# Patient Record
Sex: Male | Born: 1995 | Race: Black or African American | Hispanic: No | Marital: Single | Smoking: Former smoker
Health system: Southern US, Community
[De-identification: ages and names within clinical notes are randomized; demographics above are authoritative.]

## PROBLEM LIST (undated history)

## (undated) DIAGNOSIS — I428 Other cardiomyopathies: Secondary | ICD-10-CM

## (undated) DIAGNOSIS — K429 Umbilical hernia without obstruction or gangrene: Secondary | ICD-10-CM

## (undated) DIAGNOSIS — R0600 Dyspnea, unspecified: Secondary | ICD-10-CM

## (undated) HISTORY — PX: UMBILICAL HERNIA REPAIR: SHX196

---
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
## 2014-03-12 ENCOUNTER — Ambulatory Visit (INDEPENDENT_AMBULATORY_CARE_PROVIDER_SITE_OTHER): Payer: 59 | Admitting: Family Medicine

## 2014-03-12 VITALS — BP 118/70 | HR 70 | Temp 97.6°F | Resp 16 | Ht 67.25 in | Wt 149.8 lb

## 2014-03-12 DIAGNOSIS — S39012A Strain of muscle, fascia and tendon of lower back, initial encounter: Secondary | ICD-10-CM

## 2014-03-12 DIAGNOSIS — S335XXA Sprain of ligaments of lumbar spine, initial encounter: Secondary | ICD-10-CM

## 2014-03-12 MED ORDER — CYCLOBENZAPRINE HCL 5 MG PO TABS: ORAL_TABLET | ORAL | Status: DC

## 2014-03-12 NOTE — Progress Notes (Signed)
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  posture should be effortless for a healthy body. Returning to good posture is a gradual process. Many people can work toward this most comfortably by using various supports until they have the flexibility and strength to maintain this posture on their own. When sitting with proper posture, your ears will fall over your shoulders and your shoulders will fall over your hips. You should use the back of the chair to support your upper back. Your lower back will be in a neutral position, just slightly arched. You may place a small pillow or folded towel at the base of your lower back for support.  When working at a desk, create an environment that supports good, upright posture. Without extra support, muscles tire, which leads to excessive strain on joints and other tissues. Keep these recommendations in mind: CHAIR:  A chair should be able to slide under your desk when your back makes contact with the back of the chair. This allows you to  work closely.  The chair's height should allow your eyes to be level with the upper part of your monitor and your hands to be slightly lower than your elbows. BODY POSITION  Your feet should make contact with the floor. If this is not possible, use a foot rest.  Keep your ears over your shoulders. This will reduce stress on your neck and lower back. INCORRECT SITTING POSTURES  If you are feeling tired and unable to assume a healthy sitting posture, do not slouch or slump. This puts excessive strain on your back tissues, causing more damage and pain. Healthier options include:  Using more support, like a lumbar pillow.  Switching tasks to something that requires you to be upright or walking.  Talking a brief walk.  Lying down to rest in a neutral-spine position. PROLONGED STANDING WHILE SLIGHTLY LEANING FORWARD  When completing a task that requires you to lean forward while standing in one place for a long time, place either foot up on a stationary 2-4 inch high object to help maintain the best posture. When both feet are on the ground, the lower back tends to lose its slight inward curve. If this curve flattens (or becomes too large), then the back and your other joints will experience too much stress, tire more quickly, and can cause pain. CORRECT STANDING POSTURES Proper standing posture should be assumed with all daily activities, even if they only take a few moments, like when brushing your teeth. As in sitting, your ears should fall over your shoulders and your shoulders should fall over your hips. You should keep a slight tension in your abdominal muscles to brace your spine. Your tailbone should point down to the ground, not behind your body, resulting in an over-extended swayback posture.  INCORRECT STANDING POSTURES  Common incorrect standing postures include a forward head, locked knees and/or an excessive swayback. WALKING Walk with an upright posture. Your ears, shoulders and  hips should all line-up. PROLONGED ACTIVITY IN A FLEXED POSITION When completing a task that requires you to bend forward at your waist or lean over a low surface, try to find a way to stabilize 3 out of 4 of your limbs. You can place a hand or elbow on your thigh or rest a knee on the surface you are reaching across. This will provide you more stability so that your muscles do not fatigue as quickly. By keeping your knees relaxed, or slightly bent, you will also reduce stress across your lower back. CORRECT LIFTING TECHNIQUES DO :  Assume a wide stance. This will provide you more stability and the opportunity to get as close as possible to the object which you are lifting.  Tense your abdominals to brace your spine. Bend at the knees and hips. Keeping your back locked in a neutral-spine position, lift using your leg muscles. Lift with your legs, keeping your back straight.  Test the weight of unknown objects before attempting to lift them.  Try to keep your elbows locked down at your sides in order get the best strength from your shoulders when carrying an object.  Always ask for help when lifting heavy or awkward objects. INCORRECT LIFTING TECHNIQUES DO NOT:   Lock your knees when lifting, even if it is a small object.  Bend and twist. Pivot at your feet or move your feet when needing to change directions.  Assume that you can safely pick up even a paper clip without proper posture. Document Released: 07/05/2005 Document Revised: 09/27/2011 Document Reviewed: 10/17/2008 [facility] Patient Information 2015 [place], [place]. This information is not intended to replace advice given to you by your health care provider. Make sure you discuss any questions you have with your health care provider.    I personally performed the services described in this documentation, which was scribed in my presence. The recorded information has been reviewed and considered, and addended by me as needed.
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
## 2014-07-10 DIAGNOSIS — R072 Precordial pain: Secondary | ICD-10-CM | POA: Insufficient documentation

## 2014-08-21 ENCOUNTER — Other Ambulatory Visit (HOSPITAL_COMMUNITY): Payer: Self-pay | Admitting: Cardiovascular Disease

## 2014-08-21 DIAGNOSIS — R072 Precordial pain: Secondary | ICD-10-CM

## 2014-09-03 ENCOUNTER — Encounter (HOSPITAL_COMMUNITY): Payer: Self-pay

## 2014-09-04 ENCOUNTER — Encounter (HOSPITAL_COMMUNITY): Payer: Self-pay

## 2014-11-04 ENCOUNTER — Ambulatory Visit (INDEPENDENT_AMBULATORY_CARE_PROVIDER_SITE_OTHER): Payer: 59 | Admitting: Family Medicine

## 2014-11-04 VITALS — BP 120/72 | HR 76 | Temp 98.2°F | Resp 16 | Ht 67.0 in | Wt 159.0 lb

## 2014-11-04 DIAGNOSIS — B079 Viral wart, unspecified: Secondary | ICD-10-CM

## 2014-11-04 NOTE — Patient Instructions (Signed)
Take ibuprofen if needed for pain  Return if any concerns at any time  Plan to return next week for a recheck and treatment if necessary  If you end up not being able to work because of the discomfort call and I will give you a work excuse.

## 2014-11-04 NOTE — Progress Notes (Signed)
Subjective: 19 year old male who is here with a number of warts on his hands to be looked at. He used to wrestle, and he thinks that's where he got the first. He has at least 9 on his left hand 3 or 4 right. Works at [employer] in [employer]. He has tried self treatment at home, but the warts have persisted.  Objective: Multiple warts on both hands measuring from 1-10 mm in size.  Procedure: Cryosurgery with liquid nitrogen spray was used to treat these. On the left hand and 3 on the right were treated with microsurgical free stall refreeze technique. Patient tolerated this quite well considering the amount of wheezing and discomfort. The worse place is more at the base of the left thumb and at the left fifth finger lateral nail bed area. Also a fairly large one on his right index finger.  Assessment: Multiple viral warts, treated with cryosurgery  Plan: Try and not the lesions. Return in 8 or 9 days for recheck.  He is to contact us sooner if they're causing too much discomfort and he cannot work and needs an excuse.

## 2014-11-05 ENCOUNTER — Telehealth: Payer: Self-pay

## 2014-11-05 NOTE — Telephone Encounter (Signed)
Pt saw Alwyn Ren to have some warts removed.  He says one of them looks like a blood clot now.  Didn't know if he should be concerned.  He is supposed to come back in one week to recheck.  [phone]

## 2014-11-06 ENCOUNTER — Telehealth: Payer: Self-pay

## 2014-11-06 NOTE — Telephone Encounter (Signed)
lmom to cb. 

## 2014-11-06 NOTE — Telephone Encounter (Signed)
That sounds normal - it is probably a blood blister and should get better with time. If he is worried about it or is having increased pain, he can come in sooner than 1 week to have it looked at.

## 2014-11-06 NOTE — Telephone Encounter (Signed)
Gave pt message.

## 2014-11-12 ENCOUNTER — Ambulatory Visit (INDEPENDENT_AMBULATORY_CARE_PROVIDER_SITE_OTHER): Payer: 59 | Admitting: Family Medicine

## 2014-11-12 VITALS — BP 102/58 | HR 73 | Temp 98.1°F | Resp 19 | Ht 65.0 in | Wt 155.6 lb

## 2014-11-12 DIAGNOSIS — B079 Viral wart, unspecified: Secondary | ICD-10-CM | POA: Diagnosis not present

## 2014-11-12 NOTE — Progress Notes (Signed)
Subjective: Patient is here for recheck of the warts that I froze on him last week. Many of them have blistering underneath them and look like they have will resolve well. They're about 5 that look like they may have it inadequately frozen. Has not yet had time to slough off.  Objective: Partially treated warts on hands  Assessment: Multiple common warts  Procedure: 6 warts were frozen again. Patient tolerated the procedure well.  Plan: Refreeze ones that don't look adequately frozen.  Come back in several weeks if he is not clearing up completely.

## 2014-11-12 NOTE — Patient Instructions (Signed)
If you have problems return at anytime  If by the last 10 days of may you do not feel like all of the place cleared up please return for me to recheck them again.

## 2015-01-07 ENCOUNTER — Ambulatory Visit (INDEPENDENT_AMBULATORY_CARE_PROVIDER_SITE_OTHER): Payer: 59 | Admitting: Family Medicine

## 2015-01-07 VITALS — BP 112/74 | HR 74 | Temp 98.2°F | Resp 16 | Ht 65.0 in | Wt 156.6 lb

## 2015-01-07 DIAGNOSIS — B079 Viral wart, unspecified: Secondary | ICD-10-CM

## 2015-01-07 NOTE — Patient Instructions (Signed)
Take ibuprofen over-the-counter 200 mg pills 3-4 pills 3 times daily if needed for pain (maximum of 12 pills in 24 hours)  If any sign of warts still present in 3 weeks either return for me to re-treat or give me a call and I will make a referral to a dermatologist if this round of freezing has not helped shrink them considerably.

## 2015-01-07 NOTE — Progress Notes (Signed)
  Subjective:  Patient ID: Shawn Garcia, male    DOB: February 13, 1996  Age: 19 y.o. MRN: [id number]  Patient is here for recheck of the warts on his hands. The small ones went away, but some of the others have persisted about the same. Actually he says they are little smaller. He had moderate pain but he was able to tolerate it. He did not take any medicine for pain.   Objective:   Patient has 9 residual warts. 2 are on the right hand, one on the palm of the left hand and 6 on the dorsum primarily along the distal portions of digits. The largest ones measure about 1 cm in diameter 2 of them are adjacent to the nail, especially a large one on the left fifth finger.  Procedure note: Nine warts were frozen with a surgical freezing. Initially I used a 4 mm curette to curette all the skin off the surface that I could before doing the freeze. Free stall refreeze was used. One on the right hand and one on the left hand were frozen a third time, all of the rest were frozen twice. Good eye balls were obtained. Patient tolerated the procedure well.  Assessment & Plan:   Assessment: Warts  Plan: Patient Instructions  Take ibuprofen over-the-counter 200 mg pills 3-4 pills 3 times daily if needed for pain (maximum of 12 pills in 24 hours)  If any sign of warts still present in 3 weeks either return for me to re-treat or give me a call and I will make a referral to a dermatologist if this round of freezing has not helped shrink them considerably.    HOPPER,DAVID, MD 01/07/2015

## 2015-08-19 ENCOUNTER — Emergency Department (HOSPITAL_COMMUNITY)
Admission: EM | Admit: 2015-08-19 | Discharge: 2015-08-19 | Disposition: A | Discharge status: Home / Self Care | Payer: Self-pay | Attending: Emergency Medicine | Admitting: Emergency Medicine

## 2015-08-19 ENCOUNTER — Emergency Department (HOSPITAL_COMMUNITY): Payer: 59

## 2015-08-19 ENCOUNTER — Encounter (HOSPITAL_COMMUNITY): Payer: Self-pay

## 2015-08-19 DIAGNOSIS — R079 Chest pain, unspecified: Secondary | ICD-10-CM | POA: Insufficient documentation

## 2015-08-19 DIAGNOSIS — R002 Palpitations: Secondary | ICD-10-CM | POA: Insufficient documentation

## 2015-08-19 DIAGNOSIS — Z79899 Other long term (current) drug therapy: Secondary | ICD-10-CM | POA: Insufficient documentation

## 2015-08-19 LAB — TSH: TSH: 1.015 u[IU]/mL (ref 0.350–4.500)

## 2015-08-19 LAB — BASIC METABOLIC PANEL
ANION GAP: 10 (ref 5–15)
BUN: 9 mg/dL (ref 6–20)
CALCIUM: 9.1 mg/dL (ref 8.9–10.3)
CO2: 26 mmol/L (ref 22–32)
CREATININE: 1 mg/dL (ref 0.61–1.24)
Chloride: 105 mmol/L (ref 101–111)
GLUCOSE: 108 mg/dL — AB (ref 65–99)
Potassium: 4 mmol/L (ref 3.5–5.1)
Sodium: 141 mmol/L (ref 135–145)

## 2015-08-19 LAB — TROPONIN I

## 2015-08-19 LAB — CBC
HCT: 43.5 % (ref 39.0–52.0)
HEMOGLOBIN: 13.9 g/dL (ref 13.0–17.0)
MCH: 22.9 pg — ABNORMAL LOW (ref 26.0–34.0)
MCHC: 32 g/dL (ref 30.0–36.0)
MCV: 71.8 fL — ABNORMAL LOW (ref 78.0–100.0)
PLATELETS: 223 10*3/uL (ref 150–400)
RBC: 6.06 MIL/uL — AB (ref 4.22–5.81)
RDW: 13.9 % (ref 11.5–15.5)
WBC: 5.3 10*3/uL (ref 4.0–10.5)

## 2015-08-19 LAB — I-STAT TROPONIN, ED: Troponin i, poc: 0.02 ng/mL (ref 0.00–0.08)

## 2015-08-19 LAB — MAGNESIUM: Magnesium: 2 mg/dL (ref 1.7–2.4)

## 2015-08-19 NOTE — ED Notes (Signed)
Pt here for cp for 2 weeks, hx of cardiomyopathy pt has stopped taking his losartan and pain started getting worse. Reports he has dizzy, n/v. Sob and diaphoresis. Also reports some right leg numbness intermittently

## 2015-08-19 NOTE — ED Provider Notes (Signed)
TIME SEEN: 5:40 AM  CHIEF COMPLAINT: Chest pain  HPI: Pt is a 20 y.o. male with reported history of cardiomyopathy who is on losartan who presents to the emergency department complaints of 2 weeks of intermittent episodes of chest pain that he describes as palpitations, achy and squeezing. Last episode was at 4 AM today when he was getting ready for work. He denies to me any shortness of breath despite nursing notes. Denies diaphoresis. States he does have intermittent dizziness but none currently. He did have an episode of vomiting last week. He has had some dry cough. No lower extreme swelling or pain. He is not having any chest pain or palpitations currently. He is followed by cardiologist as his mother states that he was diagnosed with cardiomyopathy in the 10th grade after he had a syncopal event while wrestling. He has never had a cardiac catheterization. She thinks that he was diagnosed with cardiomyopathy by an echocardiogram. They have not yet called his cardiologist for follow-up. They do not know the cardiologist's name.  No history of PE, DVT, exogenous estrogen use, fracture, surgery, trauma, hospitalization, prolonged travel. No lower extremity swelling or pain. No calf tenderness.  He denies any diarrhea. No bloody stools or melena. No changes in caffeine intake. Denies illicit drug use. He is not a smoker. No syncope or presyncope.   ROS: See HPI Constitutional: no fever  Eyes: no drainage  ENT: no runny nose   Cardiovascular:   chest pain  Resp: no SOB  GI: vomiting x 1 over one week ago GU: no dysuria Integumentary: no rash  Allergy: no hives  Musculoskeletal: no leg swelling  Neurological: no slurred speech ROS otherwise negative  PAST MEDICAL HISTORY/PAST SURGICAL HISTORY:  Past Medical History  Diagnosis Date  . Cardiomyopathy [facility])     MEDICATIONS:  Prior to Admission medications   Medication Sig Start Date End Date Taking? Authorizing Provider   aspirin-acetaminophen-caffeine (EXCEDRIN MIGRAINE) [phone] MG per tablet Take 2 tablets by mouth daily as needed for pain.   Yes Historical Provider, MD  losartan (COZAAR) 25 MG tablet Take 25 mg by mouth daily. 08/16/15  Yes Historical Provider, MD  cyclobenzaprine (FLEXERIL) 5 MG tablet 1 pill by mouth up to every 8 hours as needed. Start with one pill by mouth each bedtime as needed due to sedation Patient not taking: Reported on 11/04/2014 03/12/14   Shade Flood, MD    ALLERGIES:  No Known Allergies  SOCIAL HISTORY:  Social History  Substance Use Topics  . Smoking status: Never Smoker   . Smokeless tobacco: Not on file  . Alcohol Use: No     Comment: socially    FAMILY HISTORY: History reviewed. No pertinent family history.  EXAM: BP 128/82 mmHg  Pulse 60  Temp(Src) 98.1 F (36.7 C) (Oral)  Resp 14  Ht 5\' 7"  (1.702 m)  Wt 156 lb (70.761 kg)  BMI 24.43 kg/m2  SpO2 98% CONSTITUTIONAL: Alert and oriented and responds appropriately to questions. Well-appearing; well-nourished HEAD: Normocephalic EYES: Conjunctivae clear, PERRL ENT: normal nose; no rhinorrhea; moist mucous membranes; pharynx without lesions noted NECK: Supple, no meningismus, no LAD  CARD: RRR; S1 and S2 appreciated; no murmurs, no clicks, no rubs, no gallops RESP: Normal chest excursion without splinting or tachypnea; breath sounds clear and equal bilaterally; no wheezes, no rhonchi, no rales, no hypoxia or respiratory distress, speaking full sentences ABD/GI: Normal bowel sounds; non-distended; soft, non-tender, no rebound, no guarding, no peritoneal signs BACK:  The  back appears normal and is non-tender to palpation, there is no CVA tenderness EXT: Normal ROM in all joints; non-tender to palpation; no edema; normal capillary refill; no cyanosis, no calf tenderness or swelling    SKIN: Normal color for age and race; warm NEURO: Moves all extremities equally, sensation to light touch intact diffusely,  cranial nerves II through XII intact PSYCH: The patient's mood and manner are appropriate. Grooming and personal hygiene are appropriate.  MEDICAL DECISION MAKING: Patient here with what appears to be very atypical chest pain. Reports a history of cardiomyopathy but is unable to tell me that his hypertrophic, ischemic, restrictive, etc. Reports he was diagnosed in 10th grade when he had a syncopal event while wrestling. States he does have a very active job currently. Denies a chest pain currently. He does not have any associated shortness of breath, diaphoresis. Reports he has been intermittently dizzy but not currently. He has had some palpitations. No syncope or near-syncope. EKG shows no acute abnormalities and is similar compared to multiple prior EKGs. He is hemodynamically stable and currently asymptomatic. Will obtain labs and a chest x-ray. I suspect patient will need follow-up with his outpatient cardiologist. Patient's mother at bedside states that she will call them today to schedule an appointment.  ED PROGRESS: 6:45 AM  Pt's labs are unremarkable. Potassium and magnesium are normal. Troponin negative. Chest x-ray clear. Again I doubt that this is pulmonary embolus given he has no risk factors for PE and is PERC negative.  Doubt dissection given he is asymptomatic. I feel he is safe to be discharged with outpatient follow-up. He does not appear volume overloaded. He is not describing near syncopal or syncopal events. I have advised him to hold off on any exertion until he has been seen by his cardiologist. Have discussed at length return precautions with patient and mother. We'll provide him with a work note for light duty for the next week. They will call his cardiologist at 8 AM today for outpatient follow-up. Discussed return precautions. They verbalize understanding and are comfortable with this plan.    EKG Interpretation  Date/Time:  Tuesday August 19 2015 05:00:26 EST Ventricular  Rate:  63 PR Interval:  145 QRS Duration: 87 QT Interval:  397 QTC Calculation: 406 R Axis:   71 Text Interpretation:  Sinus rhythm No significant change since last tracing Confirmed by Marji Kuehnel,  DO, Chrisa Hassan [id number]) on 08/19/2015 5:05:10 AM        Layla Maw Sharrod Achille, DO 08/19/15 2130

## 2015-08-19 NOTE — Discharge Instructions (Signed)
Nonspecific Chest Pain  °Chest pain can be caused by many different conditions. There is always a chance that your pain could be related to something serious, such as a heart attack or a blood clot in your lungs. Chest pain can also be caused by conditions that are not life-threatening. If you have chest pain, it is very important to follow up with your health care provider. °CAUSES  °Chest pain can be caused by: °· Heartburn. °· Pneumonia or bronchitis. °· Anxiety or stress. °· Inflammation around your heart (pericarditis) or lung (pleuritis or pleurisy). °· A blood clot in your lung. °· A collapsed lung (pneumothorax). It can develop suddenly on its own (spontaneous pneumothorax) or from trauma to the chest. °· Shingles infection (varicella-zoster virus). °· Heart attack. °· Damage to the bones, muscles, and cartilage that make up your chest wall. This can include: °· Bruised bones due to injury. °· Strained muscles or cartilage due to frequent or repeated coughing or overwork. °· Fracture to one or more ribs. °· Sore cartilage due to inflammation (costochondritis). °RISK FACTORS  °Risk factors for chest pain may include: °· Activities that increase your risk for trauma or injury to your chest. °· Respiratory infections or conditions that cause frequent coughing. °· Medical conditions or overeating that can cause heartburn. °· Heart disease or family history of heart disease. °· Conditions or health behaviors that increase your risk of developing a blood clot. °· Having had chicken pox (varicella zoster). °SIGNS AND SYMPTOMS °Chest pain can feel like: °· Burning or tingling on the surface of your chest or deep in your chest. °· Crushing, pressure, aching, or squeezing pain. °· Dull or sharp pain that is worse when you move, cough, or take a deep breath. °· Pain that is also felt in your back, neck, shoulder, or arm, or pain that spreads to any of these areas. °Your chest pain may come and go, or it may stay  constant. °DIAGNOSIS °Lab tests or other studies may be needed to find the cause of your pain. Your health care provider may have you take a test called an ambulatory ECG (electrocardiogram). An ECG records your heartbeat patterns at the time the test is performed. You may also have other tests, such as: °· Transthoracic echocardiogram (TTE). During echocardiography, sound waves are used to create a picture of all of the heart structures and to look at how blood flows through your heart. °· Transesophageal echocardiogram (TEE). This is a more advanced imaging test that obtains images from inside your body. It allows your health care provider to see your heart in finer detail. °· Cardiac monitoring. This allows your health care provider to monitor your heart rate and rhythm in real time. °· Holter monitor. This is a portable device that records your heartbeat and can help to diagnose abnormal heartbeats. It allows your health care provider to track your heart activity for several days, if needed. °· Stress tests. These can be done through exercise or by taking medicine that makes your heart beat more quickly. °· Blood tests. °· Imaging tests. °TREATMENT  °Your treatment depends on what is causing your chest pain. Treatment may include: °· Medicines. These may include: °· Acid blockers for heartburn. °· Anti-inflammatory medicine. °· Pain medicine for inflammatory conditions. °· Antibiotic medicine, if an infection is present. °· Medicines to dissolve blood clots. °· Medicines to treat coronary artery disease. °· Supportive care for conditions that do not require medicines. This may include: °· Resting. °· Applying heat   or cold packs to injured areas. °· Limiting activities until pain decreases. °HOME CARE INSTRUCTIONS °· If you were prescribed an antibiotic medicine, finish it all even if you start to feel better. °· Avoid any activities that bring on chest pain. °· Do not use any tobacco products, including  cigarettes, chewing tobacco, or electronic cigarettes. If you need help quitting, ask your health care provider. °· Do not drink alcohol. °· Take medicines only as directed by your health care provider. °· Keep all follow-up visits as directed by your health care provider. This is important. This includes any further testing if your chest pain does not go away. °· If heartburn is the cause for your chest pain, you may be told to keep your head raised (elevated) while sleeping. This reduces the chance that acid will go from your stomach into your esophagus. °· Make lifestyle changes as directed by your health care provider. These may include: °· Getting regular exercise. Ask your health care provider to suggest some activities that are safe for you. °· Eating a heart-healthy diet. A registered dietitian can help you to learn healthy eating options. °· Maintaining a healthy weight. °· Managing diabetes, if necessary. °· Reducing stress. °SEEK MEDICAL CARE IF: °· Your chest pain does not go away after treatment. °· You have a rash with blisters on your chest. °· You have a fever. °SEEK IMMEDIATE MEDICAL CARE IF:  °· Your chest pain is worse. °· You have an increasing cough, or you cough up blood. °· You have severe abdominal pain. °· You have severe weakness. °· You faint. °· You have chills. °· You have sudden, unexplained chest discomfort. °· You have sudden, unexplained discomfort in your arms, back, neck, or jaw. °· You have shortness of breath at any time. °· You suddenly start to sweat, or your skin gets clammy. °· You feel nauseous or you vomit. °· You suddenly feel light-headed or dizzy. °· Your heart begins to beat quickly, or it feels like it is skipping beats. °These symptoms may represent a serious problem that is an emergency. Do not wait to see if the symptoms will go away. Get medical help right away. Call your local emergency services (911 in the [place] Do not drive yourself to the hospital. °  °This  information is not intended to replace advice given to you by your health care provider. Make sure you discuss any questions you have with your health care provider. °  °Document Released: 04/14/2005 Document Revised: 07/26/2014 Document Reviewed: 02/08/2014 °Elsevier Interactive Patient Education ©2016 [employer] ° °Palpitations °A palpitation is the feeling that your heartbeat is irregular or is faster than normal. It may feel like your heart is fluttering or skipping a beat. Palpitations are usually not a serious problem. However, in some cases, you may need further medical evaluation. °CAUSES  °Palpitations can be caused by: °· Smoking. °· Caffeine or other stimulants, such as diet pills or energy drinks. °· Alcohol. °· Stress and anxiety. °· Strenuous physical activity. °· Fatigue. °· Certain medicines. °· Heart disease, especially if you have a history of irregular heart rhythms (arrhythmias), such as atrial fibrillation, atrial flutter, or supraventricular tachycardia. °· An improperly working pacemaker or defibrillator. °DIAGNOSIS  °To find the cause of your palpitations, your health care provider will take your medical history and perform a physical exam. Your health care provider may also have you take a test called an ambulatory electrocardiogram (ECG). An ECG records your heartbeat patterns over a 24-hour   period. You may also have other tests, such as: °· Transthoracic echocardiogram (TTE). During echocardiography, sound waves are used to evaluate how blood flows through your heart. °· Transesophageal echocardiogram (TEE). °· Cardiac monitoring. This allows your health care provider to monitor your heart rate and rhythm in real time. °· Holter monitor. This is a portable device that records your heartbeat and can help diagnose heart arrhythmias. It allows your health care provider to track your heart activity for several days, if needed. °· Stress tests by exercise or by giving medicine that makes the  heart beat faster. °TREATMENT  °Treatment of palpitations depends on the cause of your symptoms and can vary greatly. Most cases of palpitations do not require any treatment other than time, relaxation, and monitoring your symptoms. Other causes, such as atrial fibrillation, atrial flutter, or supraventricular tachycardia, usually require further treatment. °HOME CARE INSTRUCTIONS  °· Avoid: °¨ Caffeinated coffee, tea, soft drinks, diet pills, and energy drinks. °¨ Chocolate. °¨ Alcohol. °· Stop smoking if you smoke. °· Reduce your stress and anxiety. Things that can help you relax include: °¨ A method of controlling things in your body, such as your heartbeats, with your mind (biofeedback). °¨ Yoga. °¨ Meditation. °¨ Physical activity such as swimming, jogging, or walking. °· Get plenty of rest and sleep. °SEEK MEDICAL CARE IF:  °· You continue to have a fast or irregular heartbeat beyond 24 hours. °· Your palpitations occur more often. °SEEK IMMEDIATE MEDICAL CARE IF: °· You have chest pain or shortness of breath. °· You have a severe headache. °· You feel dizzy or you faint. °MAKE SURE YOU: °· Understand these instructions. °· Will watch your condition. °· Will get help right away if you are not doing well or get worse. °  °This information is not intended to replace advice given to you by your health care provider. Make sure you discuss any questions you have with your health care provider. °  °Document Released: 07/02/2000 Document Revised: 07/10/2013 Document Reviewed: 09/03/2011 °Elsevier Interactive Patient Education ©2016 [employer] ° °

## 2015-08-28 DIAGNOSIS — R0609 Other forms of dyspnea: Secondary | ICD-10-CM

## 2015-08-28 DIAGNOSIS — R0602 Shortness of breath: Secondary | ICD-10-CM | POA: Insufficient documentation

## 2016-04-21 ENCOUNTER — Encounter (HOSPITAL_COMMUNITY): Payer: Self-pay | Admitting: *Deleted

## 2016-04-21 ENCOUNTER — Emergency Department (HOSPITAL_COMMUNITY)
Admission: EM | Admit: 2016-04-21 | Discharge: 2016-04-21 | Disposition: A | Discharge status: Home / Self Care | Payer: Self-pay | Attending: Emergency Medicine | Admitting: Emergency Medicine

## 2016-04-21 ENCOUNTER — Emergency Department (HOSPITAL_COMMUNITY): Payer: Self-pay

## 2016-04-21 DIAGNOSIS — J029 Acute pharyngitis, unspecified: Secondary | ICD-10-CM | POA: Insufficient documentation

## 2016-04-21 DIAGNOSIS — J189 Pneumonia, unspecified organism: Secondary | ICD-10-CM

## 2016-04-21 DIAGNOSIS — T59811A Toxic effect of smoke, accidental (unintentional), initial encounter: Secondary | ICD-10-CM

## 2016-04-21 DIAGNOSIS — J705 Respiratory conditions due to smoke inhalation: Secondary | ICD-10-CM | POA: Insufficient documentation

## 2016-04-21 LAB — CARBOXYHEMOGLOBIN
Carboxyhemoglobin: 0.9 % (ref 0.5–1.5)
METHEMOGLOBIN: 0.8 % (ref 0.0–1.5)
O2 Saturation: 99 %
Total hemoglobin: 13.9 g/dL (ref 12.0–16.0)

## 2016-04-21 MED ORDER — IPRATROPIUM-ALBUTEROL 0.5-2.5 (3) MG/3ML IN SOLN
3.0000 mL | Freq: Once | RESPIRATORY_TRACT | Status: AC
  Administered 2016-04-21: 3 mL via RESPIRATORY_TRACT
  Filled 2016-04-21: qty 3

## 2016-04-21 MED ORDER — BENZONATATE 100 MG PO CAPS: 100.0000 mg | ORAL_CAPSULE | Freq: Three times a day (TID) | ORAL | 0 refills | Status: DC | PRN

## 2016-04-21 MED ORDER — LIDOCAINE VISCOUS 2 % MT SOLN: 20.0000 mL | OROMUCOSAL | 0 refills | Status: DC | PRN

## 2016-04-21 MED ORDER — ALBUTEROL SULFATE HFA 108 (90 BASE) MCG/ACT IN AERS
2.0000 | INHALATION_SPRAY | Freq: Once | RESPIRATORY_TRACT | Status: AC
  Administered 2016-04-21: 2 via RESPIRATORY_TRACT
  Filled 2016-04-21: qty 6.7
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
## 2016-09-17 ENCOUNTER — Other Ambulatory Visit: Payer: Self-pay | Admitting: Family Medicine

## 2016-09-17 ENCOUNTER — Telehealth: Payer: Self-pay | Admitting: Family Medicine

## 2016-09-17 ENCOUNTER — Ambulatory Visit (INDEPENDENT_AMBULATORY_CARE_PROVIDER_SITE_OTHER): Payer: Managed Care, Other (non HMO) | Admitting: Family Medicine

## 2016-09-17 ENCOUNTER — Ambulatory Visit (HOSPITAL_COMMUNITY)
Admission: RE | Admit: 2016-09-17 | Discharge: 2016-09-17 | Disposition: A | Discharge status: Home / Self Care | Payer: Managed Care, Other (non HMO) | Source: Ambulatory Visit | Attending: Family Medicine | Admitting: Family Medicine

## 2016-09-17 VITALS — BP 122/72 | HR 69 | Temp 98.0°F | Resp 17 | Ht 68.5 in | Wt 158.0 lb

## 2016-09-17 DIAGNOSIS — H65191 Other acute nonsuppurative otitis media, right ear: Secondary | ICD-10-CM | POA: Diagnosis not present

## 2016-09-17 DIAGNOSIS — R1907 Generalized intra-abdominal and pelvic swelling, mass and lump: Secondary | ICD-10-CM

## 2016-09-17 NOTE — Telephone Encounter (Signed)
Left voicemail advising of unremarkable findings from ultrasound of abdomen today. Advise if symptoms persist return to office for further evaluation and will possibly consider a CT scan

## 2016-09-17 NOTE — Progress Notes (Signed)
Patient ID: Shawn Garcia, male    DOB: 1996/06/06, 21 y.o.   MRN: [id number]  PCP: Pcp Not In System  Chief Complaint  Patient presents with  . Hernia    Subjective:  HPI-Initial Encounter With Patient  21 year old male presents for evaluation of possible hernia x 2 months. Reports that he noticed a mass above his umbilicus which he feels has increased in size over the last 2 months. Reports that mass is sometimes painful above his belly button. Noticed the mass or lump a few months but it has increased in size. He did some recent and feels like he may have a hernia. No recent fevers or abdominal pain. Reports bowel movements nor does he associate protrusion of the mass with defecating. He is very physically active and participates in [employer]. Denies any recent blows or kicks to his abdomen.  Social History   Social History  . Marital status: Single    Spouse name: N/A  . Number of children: N/A  . Years of education: N/A   Occupational History  . Not on file.   Social History Main Topics  . Smoking status: Never Smoker  . Smokeless tobacco: Never Used  . Alcohol use No     Comment: socially  . Drug use: No  . Sexual activity: No   Other Topics Concern  . Not on file   Social History Narrative  . No narrative on file   Review of Systems See HPI  Patient Active Problem List   Diagnosis Date Noted  . Warts 01/07/2015    No Known Allergies  Prior to Admission medications   Not on File    Past Medical, Surgical Family and Social History reviewed and updated.    Objective:   Today's Vitals   09/17/16 0839  BP: 122/72  Pulse: 69  Resp: 17  Temp: 98 F (36.7 C)  TempSrc: Oral  SpO2: 97%  Weight: 158 lb (71.7 kg)  Height: 5' 8.5" (1.74 m)    Wt Readings from Last 3 Encounters:  09/17/16 158 lb (71.7 kg)  08/19/15 156 lb (70.8 kg)  01/07/15 156 lb 9.6 oz (71 kg)   Physical Exam  Constitutional: He is oriented to person, place, and  time. He appears well-developed.  HENT:  Head: Normocephalic and atraumatic.  Right Ear: External ear normal.  Left Ear: External ear normal.  Neck: Normal range of motion. Neck supple.  Cardiovascular: Normal rate, regular rhythm, normal heart sounds and intact distal pulses.   Pulmonary/Chest: Effort normal and breath sounds normal.  Abdominal: Soft. He exhibits mass. He exhibits no distension. There is no tenderness. There is no rebound and no guarding.  Palpated <53mm pea size solid mass in 7 o'clock position of the umbilicus-pt reports that is not mass he is referencing. No other solid or mobile type nodule or mass noted on physical exam   Neurological: He is alert and oriented to person, place, and time.  Skin: Skin is warm and dry.  Psychiatric: He has a normal mood and affect. His behavior is normal. Judgment and thought content normal.      Assessment & Plan:  1. Generalized abdominal mass, questionable hernia or mass. Further imaging needed to identify mass palpated during exam. - US Abdomen Complete-Stat   2. Acute effusion of right ear, -Treat with OTC Flonase and Zyrtec10 mg daily.  You will be notified of your ultrasound results.  Godfrey Pick. Tiburcio Pea, MSN, FNP-C Primary Care at  [facility] [phone]

## 2016-09-17 NOTE — Patient Instructions (Addendum)
Abdominal Mass -You are going for an ultrasound of your abdomen      Fluid in middle ear: Purchase over the counter Flonase Nasal spray and Zyrtec 10 mg tablets.  Use daily until ear tenderness resolves. This can take up to 4-6 weeks.     IF you received an x-ray today, you will receive an invoice from [facility]. Please contact [facility] at [phone] with questions or concerns regarding your invoice.   IF you received labwork today, you will receive an invoice from [facility]. Please contact [facility] at [phone] with questions or concerns regarding your invoice.   Our billing staff will not be able to assist you with questions regarding bills from these companies.  You will be contacted with the lab results as soon as they are available. The fastest way to get your results is to activate your My Chart account. Instructions are located on the last page of this paperwork. If you have not heard from [place] regarding the results in 2 weeks, please contact this office.

## 2016-09-21 ENCOUNTER — Telehealth: Payer: Self-pay | Admitting: General Practice

## 2016-09-21 DIAGNOSIS — R1905 Periumbilic swelling, mass or lump: Secondary | ICD-10-CM

## 2016-09-21 NOTE — Telephone Encounter (Signed)
Pt is looking for lab results   Best number [phone]

## 2016-09-21 NOTE — Telephone Encounter (Signed)
Spoke with patient regarding ultrasound results. He reports that he is still experiencing pain and feels a mass in above umbilicus in abdomen. He would like to obtain CT scan. Advised that this diagnostic imaging will have to be approved by his health insurance plan prior to the scan being scheduled. He verbalized understanding. Submitting CT of Abdomen w contrast for future.  Godfrey Pick. Tiburcio Pea, MSN, FNP-C Primary Care at [facility] [phone]

## 2016-09-24 NOTE — Progress Notes (Unsigned)
Needed new order per GI

## 2016-09-25 ENCOUNTER — Telehealth: Payer: Self-pay | Admitting: General Practice

## 2016-09-25 NOTE — Telephone Encounter (Signed)
Pt is callling back to get his lab results   Best number [phone]

## 2016-09-27 NOTE — Telephone Encounter (Signed)
Returned call to patient regarding pending CT scan. Explained that order has been placed and is presently pending approval by insurance provider. Advise him that he will be contacted upon insurance approval to schedule CT scan.  Godfrey Pick. Tiburcio Pea, MSN, FNP-C Primary Care at [facility] [phone]

## 2016-11-15 ENCOUNTER — Telehealth: Payer: Self-pay | Admitting: General Practice

## 2016-11-15 NOTE — Telephone Encounter (Signed)
Pt seen by Tiburcio Pea for abdominal pain on 3/2 w unremarkable U/S. Still having the same symptoms. Would like a referral to gastro before continuing with the CT. Please place if appropriate and I will inform pt.

## 2016-11-16 NOTE — Telephone Encounter (Signed)
See ov note and advise.

## 2016-11-17 NOTE — Telephone Encounter (Signed)
Please call pt and inform him to come in for reevaluation of this mass and these symptoms as NP Harris no longer works here. GI referral may not be the best option for him. Thanks!

## 2016-11-18 NOTE — Telephone Encounter (Signed)
Left message of info on pt vm.

## 2016-12-08 ENCOUNTER — Emergency Department (HOSPITAL_COMMUNITY): Payer: Managed Care, Other (non HMO)

## 2016-12-08 ENCOUNTER — Encounter (HOSPITAL_COMMUNITY): Payer: Self-pay

## 2016-12-08 ENCOUNTER — Emergency Department (HOSPITAL_COMMUNITY)
Admission: EM | Admit: 2016-12-08 | Discharge: 2016-12-08 | Disposition: A | Discharge status: Home / Self Care | Payer: Managed Care, Other (non HMO) | Attending: Emergency Medicine | Admitting: Emergency Medicine

## 2016-12-08 DIAGNOSIS — R1084 Generalized abdominal pain: Secondary | ICD-10-CM

## 2016-12-08 DIAGNOSIS — R197 Diarrhea, unspecified: Secondary | ICD-10-CM | POA: Diagnosis not present

## 2016-12-08 DIAGNOSIS — R112 Nausea with vomiting, unspecified: Secondary | ICD-10-CM | POA: Diagnosis present

## 2016-12-08 LAB — COMPREHENSIVE METABOLIC PANEL
ALBUMIN: 5 g/dL (ref 3.5–5.0)
ALT: 17 U/L (ref 17–63)
AST: 23 U/L (ref 15–41)
Alkaline Phosphatase: 61 U/L (ref 38–126)
Anion gap: 8 (ref 5–15)
BUN: 13 mg/dL (ref 6–20)
CHLORIDE: 106 mmol/L (ref 101–111)
CO2: 26 mmol/L (ref 22–32)
CREATININE: 1 mg/dL (ref 0.61–1.24)
Calcium: 9.4 mg/dL (ref 8.9–10.3)
GFR calc Af Amer: >60 mL/min (ref >60)
GFR calc non Af Amer: >60 mL/min (ref >60)
Glucose, Bld: 128 mg/dL — ABNORMAL HIGH (ref 65–99)
POTASSIUM: 3.8 mmol/L (ref 3.5–5.1)
SODIUM: 140 mmol/L (ref 135–145)
Total Bilirubin: 0.8 mg/dL (ref 0.3–1.2)
Total Protein: 8.1 g/dL (ref 6.5–8.1)

## 2016-12-08 LAB — URINALYSIS, ROUTINE W REFLEX MICROSCOPIC
BACTERIA UA: NONE SEEN
Bilirubin Urine: NEGATIVE
Glucose, UA: NEGATIVE mg/dL
Hgb urine dipstick: NEGATIVE
KETONES UR: NEGATIVE mg/dL
Leukocytes, UA: NEGATIVE
Nitrite: NEGATIVE
PH: 7 (ref 5.0–8.0)
Protein, ur: 100 mg/dL — AB
SQUAMOUS EPITHELIAL / LPF: NONE SEEN
Specific Gravity, Urine: 1.03 (ref 1.005–1.030)

## 2016-12-08 LAB — CBC
HEMATOCRIT: 45 % (ref 39.0–52.0)
Hemoglobin: 14.8 g/dL (ref 13.0–17.0)
MCH: 23.5 pg — AB (ref 26.0–34.0)
MCHC: 32.9 g/dL (ref 30.0–36.0)
MCV: 71.4 fL — ABNORMAL LOW (ref 78.0–100.0)
Platelets: 186 10*3/uL (ref 150–400)
RBC: 6.3 MIL/uL — AB (ref 4.22–5.81)
RDW: 13.8 % (ref 11.5–15.5)
WBC: 11.3 10*3/uL — AB (ref 4.0–10.5)

## 2016-12-08 LAB — I-STAT CG4 LACTIC ACID, ED
LACTIC ACID, VENOUS: 2.85 mmol/L — AB (ref 0.5–1.9)
Lactic Acid, Venous: 1.16 mmol/L (ref 0.5–1.9)

## 2016-12-08 LAB — LIPASE, BLOOD: LIPASE: 24 U/L (ref 11–51)

## 2016-12-08 MED ORDER — IOPAMIDOL (ISOVUE-300) INJECTION 61%
100.0000 mL | Freq: Once | INTRAVENOUS | Status: AC | PRN
  Administered 2016-12-08: 100 mL via INTRAVENOUS

## 2016-12-08 MED ORDER — IOPAMIDOL (ISOVUE-300) INJECTION 61%
INTRAVENOUS | Status: AC
  Filled 2016-12-08: qty 100

## 2016-12-08 MED ORDER — FENTANYL CITRATE (PF) 100 MCG/2ML IJ SOLN
50.0000 ug | Freq: Once | INTRAMUSCULAR | Status: AC
  Administered 2016-12-08: 50 ug via INTRAVENOUS
  Filled 2016-12-08: qty 2

## 2016-12-08 MED ORDER — ONDANSETRON 4 MG PO TBDP
4.0000 mg | ORAL_TABLET | Freq: Once | ORAL | Status: AC | PRN
  Administered 2016-12-08: 4 mg via ORAL
  Filled 2016-12-08: qty 1

## 2016-12-08 MED ORDER — ONDANSETRON HCL 4 MG PO TABS: 4.0000 mg | ORAL_TABLET | Freq: Four times a day (QID) | ORAL | 0 refills | Status: DC

## 2016-12-08 MED ORDER — SODIUM CHLORIDE 0.9 % IV BOLUS (SEPSIS)
1000.0000 mL | Freq: Once | INTRAVENOUS | Status: AC
  Administered 2016-12-08: 1000 mL via INTRAVENOUS

## 2016-12-08 NOTE — ED Notes (Signed)
Abnormal lab result MD Molpus have been made aware

## 2016-12-08 NOTE — ED Notes (Signed)
Bed: [facility] Expected date:  Expected time:  Means of arrival:  Comments: 

## 2016-12-08 NOTE — ED Provider Notes (Signed)
[facility]-EMERGENCY DEPT Provider Note   CSN: [id number] Arrival date & time: 12/08/16  0021  By signing my name below, I, Diona Browner, attest that this documentation has been prepared under the direction and in the presence of Roxy Horseman, PA-C. Electronically Signed: Diona Browner, ED Scribe. 12/08/16. 1:53 AM.  History   Chief Complaint Chief Complaint  Patient presents with  . Abdominal Pain    HPI Comments: Shawn Garcia is a 21 y.o. male with a PMHx of cardiomyopathy who presents to the Emergency Department with a chief complaint of gradually worsening central abdominal pain that started ~ 5 pm on 12/07/16. Associated sx include nausea, vomiting, diarrhea, fever and chills. No modifying factors at this time. No hx of kidney stones. Pt denies penis pain, scrotum pain, dysuria and hematuria.   The history is provided by the patient and the spouse. No language interpreter was used.    Past Medical History:  Diagnosis Date  . Cardiomyopathy [facility])     Patient Active Problem List   Diagnosis Date Noted  . Warts 01/07/2015    History reviewed. No pertinent surgical history.     Home Medications    Prior to Admission medications   Not on File    Family History History reviewed. No pertinent family history.  Social History Social History  Substance Use Topics  . Smoking status: Never Smoker  . Smokeless tobacco: Never Used  . Alcohol use No     Comment: socially     Allergies   Patient has no known allergies.   Review of Systems Review of Systems  Constitutional: Positive for chills and fever.  Gastrointestinal: Positive for abdominal pain, diarrhea, nausea and vomiting.  Genitourinary: Negative for dysuria, hematuria, penile pain and testicular pain.     Physical Exam Updated Vital Signs BP 107/69   Pulse 93   Temp 98 F (36.7 C)   Resp 18   SpO2 100%   Physical Exam  Constitutional: He is oriented to person, place, and time. He appears  well-developed and well-nourished.  HENT:  Head: Normocephalic.  Eyes: EOM are normal.  Neck: Normal range of motion.  Cardiovascular: Normal rate, regular rhythm, normal heart sounds and intact distal pulses.  Exam reveals no gallop and no friction rub.   No murmur heard. Pulmonary/Chest: Effort normal and breath sounds normal. No respiratory distress. He has no wheezes. He has no rales. He exhibits no tenderness.  Abdominal: He exhibits no distension. There is tenderness.  Tenderness to palpation periumbilically.   Musculoskeletal: Normal range of motion.  Neurological: He is alert and oriented to person, place, and time.  Psychiatric: He has a normal mood and affect.  Nursing note and vitals reviewed.    ED Treatments / Results  DIAGNOSTIC STUDIES: Oxygen Saturation is 100% on RA, normal by my interpretation.   COORDINATION OF CARE: 1:53 AM-Discussed next steps with pt. Pt verbalized understanding and is agreeable with the plan.   Labs (all labs ordered are listed, but only abnormal results are displayed) Labs Reviewed  COMPREHENSIVE METABOLIC PANEL - Abnormal; Notable for the following:       Result Value   Glucose, Bld 128 (*)    All other components within normal limits  CBC - Abnormal; Notable for the following:    WBC 11.3 (*)    RBC 6.30 (*)    MCV 71.4 (*)    MCH 23.5 (*)    All other components within normal limits  URINALYSIS, ROUTINE W REFLEX  MICROSCOPIC - Abnormal; Notable for the following:    Color, Urine AMBER (*)    APPearance HAZY (*)    Protein, ur 100 (*)    All other components within normal limits  LIPASE, BLOOD    EKG  EKG Interpretation None       Radiology Ct Abdomen Pelvis W Contrast  Result Date: 12/08/2016 CLINICAL DATA:  Periumbilical pain. Nausea, vomiting, diarrhea, fever, and chills. EXAM: CT ABDOMEN AND PELVIS WITH CONTRAST TECHNIQUE: Multidetector CT imaging of the abdomen and pelvis was performed using the standard protocol  following bolus administration of intravenous contrast. CONTRAST:  100 mL Isovue-300 COMPARISON:  None. FINDINGS: Lower chest: The lung bases are clear. Hepatobiliary: No focal liver abnormality is seen. No gallstones, gallbladder wall thickening, or biliary dilatation. Pancreas: Unremarkable. No pancreatic ductal dilatation or surrounding inflammatory changes. Spleen: Normal in size without focal abnormality. Adrenals/Urinary Tract: Adrenal glands are unremarkable. Kidneys are normal, without renal calculi, focal lesion, or hydronephrosis. Bladder is unremarkable. Stomach/Bowel: Fluid-filled colon consistent with history of diarrhea. No colonic distention or wall thickening. No pericolonic inflammatory changes. No small bowel distention or wall thickening. Stomach is unremarkable. Vascular/Lymphatic: No significant vascular findings are present. No enlarged abdominal or pelvic lymph nodes. Reproductive: Prostate is unremarkable. Other: No abdominal wall hernia or abnormality. No abdominopelvic ascites. Musculoskeletal: No acute or significant osseous findings. IMPRESSION: No acute process demonstrated in the abdomen or pelvis. Fluid-filled colon is consistent with history of diarrhea. No evidence of bowel obstruction or inflammatory change. Electronically Signed   By: Burman Nieves M.D.   On: 12/08/2016 02:44    Procedures Procedures (including critical care time)  Medications Ordered in ED Medications  fentaNYL (SUBLIMAZE) injection 50 mcg (not administered)  ondansetron (ZOFRAN-ODT) disintegrating tablet 4 mg (4 mg Oral Given 12/08/16 0047)     Initial Impression / Assessment and Plan / ED Course  I have reviewed the triage vital signs and the nursing notes.  Pertinent labs & imaging results that were available during my care of the patient were reviewed by me and considered in my medical decision making (see chart for details).    Patient with generalized bone pain, nausea, vomiting,  diarrhea that started earlier today. He was initially writhing around on the bed and his pain was uncontrollable. Patient did have good improvement with fentanyl and Zofran. CT was ordered because of periumbilical pain. CT scan showed no acute findings, but did show fluid-filled colon which is consistent with patient's diarrhea. Lactic acid was elevated, will recheck after fluids.  3:30 AM Patient feels greatly improved now. I believe that he will be stable for discharge pending and improved lactic acid. Patient expresses concern about possible umbilical hernia. I see no evidence of this on exam, however the way that he describes his symptoms does seem consistent with hernia. There is no evidence of strangulation or incarceration or any evidence of hernia on CT, but I will give him a referral to Gen. surgery per his request.  Repeat lactate is normal. Patient is stable and ready for discharge. Final Clinical Impressions(s) / ED Diagnoses   Final diagnoses:  Nausea vomiting and diarrhea  Generalized abdominal pain    New Prescriptions New Prescriptions   ONDANSETRON (ZOFRAN) 4 MG TABLET    Take 1 tablet (4 mg total) by mouth every 6 (six) hours.   I personally performed the services described in this documentation, which was scribed in my presence. The recorded information has been reviewed and is accurate.  Roxy Horseman, PA-C 12/08/16 0431    Paula Libra, MD 12/08/16 [phone]

## 2016-12-08 NOTE — ED Notes (Signed)
Patient transported to CT 

## 2016-12-08 NOTE — Discharge Instructions (Signed)
We do not see any evidence of hernia on exam or CT today.  If the mass enlarges, or if you worsen you may benefit from seeing a general surgeon.  Please see the attached contact information should you choose to pursue this further.

## 2016-12-08 NOTE — ED Triage Notes (Signed)
Pt reports abd pain, n/v, diarrhea that started today. Pt A+OX4, speaking in complete sentences, ambulatory to triage.

## 2017-05-12 ENCOUNTER — Ambulatory Visit (HOSPITAL_COMMUNITY): Payer: Managed Care, Other (non HMO)

## 2017-05-12 ENCOUNTER — Emergency Department (HOSPITAL_COMMUNITY)
Admission: EM | Admit: 2017-05-12 | Discharge: 2017-05-12 | Disposition: A | Discharge status: Home / Self Care | Payer: Managed Care, Other (non HMO) | Attending: Emergency Medicine | Admitting: Emergency Medicine

## 2017-05-12 ENCOUNTER — Encounter (HOSPITAL_COMMUNITY): Payer: Self-pay | Admitting: Emergency Medicine

## 2017-05-12 DIAGNOSIS — Z5321 Procedure and treatment not carried out due to patient leaving prior to being seen by health care provider: Secondary | ICD-10-CM | POA: Insufficient documentation

## 2017-05-12 DIAGNOSIS — R079 Chest pain, unspecified: Secondary | ICD-10-CM | POA: Insufficient documentation

## 2017-05-12 NOTE — ED Notes (Signed)
Writer attempted to collect blood, pt stated he is okay and ready to go.

## 2017-05-12 NOTE — ED Notes (Signed)
Pt informed staff that he did not want to stay for blood work and was going to go home.

## 2017-05-12 NOTE — ED Triage Notes (Signed)
Pt reports having shortness of breath and chest pain that started appx 2 hours ago and heart rate went to 117 after exertion. Pt reports prior hx of cardiomyopathy.

## 2017-08-02 DIAGNOSIS — R002 Palpitations: Secondary | ICD-10-CM | POA: Diagnosis not present

## 2017-08-02 DIAGNOSIS — I428 Other cardiomyopathies: Secondary | ICD-10-CM | POA: Diagnosis not present

## 2017-09-16 ENCOUNTER — Ambulatory Visit: Payer: Self-pay | Admitting: Family Medicine

## 2017-09-16 ENCOUNTER — Encounter: Payer: Self-pay | Admitting: Family Medicine

## 2017-09-16 ENCOUNTER — Ambulatory Visit: Payer: BLUE CROSS/BLUE SHIELD | Admitting: Family Medicine

## 2017-09-16 VITALS — BP 122/78 | HR 72 | Temp 98.4°F | Ht 67.0 in | Wt 158.0 lb

## 2017-09-16 DIAGNOSIS — K439 Ventral hernia without obstruction or gangrene: Secondary | ICD-10-CM | POA: Diagnosis not present

## 2017-09-16 NOTE — Assessment & Plan Note (Addendum)
Previous imaging has not shown a hernia. Possible that the defect has increased in size since today there appears to be a defect ventrally. Having some pain. Having normal bowel movements. 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  - discussed referral to surgery for further evaluation. He would like to defer for now.  - counseled on supportive care  - given indications follow up

## 2017-09-16 NOTE — Patient Instructions (Signed)
Please try wrapping an ace wrap around your abdomen when you are practicing.  Please try the exercises I have provided  Please let me know if your symptoms worsen.

## 2017-09-16 NOTE — Progress Notes (Signed)
Shawn Garcia - 22 y.o. male MRN [id number]  Date of birth: 1996-06-14  SUBJECTIVE:  Including CC & ROS.  Chief Complaint  Patient presents with  . Umbilical Hernia    Shawn Garcia is a 22 y.o. male that is here for an evaluation of an umbilical hernia. He noticed the bulge a couple of years ago. Admits to an increase in size. Admits to pain and tenderness. It does pop in and out. He notices the bulge more when he is lifting. Denies urinary or bowel issues. No prior abdominal surgery. Pain is localized. 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   Review of [place] from 2018 shows no hernia.   Review of the CT abdomen pelvis from 2018 shows no abdominal wall hernia  Review of Systems  Constitutional: Negative for fever.  HENT: Negative for congestion.   Respiratory: Negative for cough.   Cardiovascular: Negative for chest pain.  Gastrointestinal: Positive for abdominal pain.  Musculoskeletal: Negative for back pain.  Skin: Negative for color change.  Neurological: Negative for weakness.  Hematological: Negative for adenopathy.  Psychiatric/Behavioral: Negative for agitation.    HISTORY: Past Medical, Surgical, Social, and Family History Reviewed & Updated per EMR.   Pertinent Historical Findings include:  Past Medical History:  Diagnosis Date  . Cardiomyopathy (HCC)     No past surgical history on file.  No Known Allergies  No family history on file.   Social History   Socioeconomic History  . Marital status: Single    Spouse name: Not on file  . Number of children: Not on file  . Years of education: Not on file  . Highest education level: Not on file  Social Needs  . Financial resource strain: Not on file  . Food insecurity - worry: Not on file  . Food insecurity - inability: Not on file  . Transportation needs - medical: Not on file  . Transportation needs - non-medical: Not on file  Occupational History  .  Not on file  Tobacco Use  . Smoking status: Never Smoker  . Smokeless tobacco: Never Used  Substance and Sexual Activity  . Alcohol use: No    Comment: socially  . Drug use: No  . Sexual activity: No  Other Topics Concern  . Not on file  Social History Narrative  . Not on file     PHYSICAL EXAM:  VS: BP 122/78 (BP Location: Left Arm, Patient Position: Sitting, Cuff Size: Normal)   Pulse 72   Temp 98.4 F (36.9 C) (Oral)   Ht 5\' 7"  (1.702 m)   Wt 158 lb (71.7 kg)   SpO2 98%   BMI 24.75 kg/m  Physical Exam Gen: NAD, alert, cooperative with exam, well-appearing ENT: normal lips, normal nasal mucosa,  Eye: normal EOM, normal conjunctiva and lids CV:  no edema, +2 pedal pulses   Resp: no accessory muscle use, non-labored,  GI: no masses, soft, has a mild protrusion proximal to there umbilicus, able to reduce, non distended Skin: no rashes, no areas of induration  Neuro: normal tone, normal sensation to touch Psych:  normal insight, alert and oriented MSK: normal gait, normal strength        ASSESSMENT & PLAN:   Ventral hernia without obstruction or gangrene Previous imaging has not shown a hernia. Possible that the defect has increased in size since today there appears to be a defect ventrally. Having some pain. Having normal bowel movements.  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  - discussed referral to surgery for further evaluation. He would like to defer for now.  - counseled on supportive care  - given indications follow up

## 2017-09-16 NOTE — Progress Notes (Deleted)
  Shawn Garcia - 22 y.o. male MRN [id number]  Date of birth: Mar 09, 1996  SUBJECTIVE:  Including CC & ROS.  No chief complaint on file.   Shawn Garcia is a 22 y.o. male that is  ***.  ***   Review of Systems  HISTORY: Past Medical, Surgical, Social, and Family History Reviewed & Updated per EMR.   Pertinent Historical Findings include:  Past Medical History:  Diagnosis Date  . Cardiomyopathy (HCC)     No past surgical history on file.  No Known Allergies  No family history on file.   Social History   Socioeconomic History  . Marital status: Single    Spouse name: Not on file  . Number of children: Not on file  . Years of education: Not on file  . Highest education level: Not on file  Social Needs  . Financial resource strain: Not on file  . Food insecurity - worry: Not on file  . Food insecurity - inability: Not on file  . Transportation needs - medical: Not on file  . Transportation needs - non-medical: Not on file  Occupational History  . Not on file  Tobacco Use  . Smoking status: Never Smoker  . Smokeless tobacco: Never Used  Substance and Sexual Activity  . Alcohol use: No    Comment: socially  . Drug use: No  . Sexual activity: No  Other Topics Concern  . Not on file  Social History Narrative  . Not on file     PHYSICAL EXAM:  VS: There were no vitals taken for this visit. Physical Exam Gen: NAD, alert, cooperative with exam, well-appearing ENT: normal lips, normal nasal mucosa,  Eye: normal EOM, normal conjunctiva and lids CV:  no edema, +2 pedal pulses   Resp: no accessory muscle use, non-labored,  GI: no masses or tenderness, no hernia  Skin: no rashes, no areas of induration  Neuro: normal tone, normal sensation to touch Psych:  normal insight, alert and oriented MSK:  ***      ASSESSMENT & PLAN:   No problem-specific Assessment & Plan notes found for this encounter.

## 2017-09-24 DIAGNOSIS — M9905 Segmental and somatic dysfunction of pelvic region: Secondary | ICD-10-CM | POA: Diagnosis not present

## 2017-09-24 DIAGNOSIS — M9903 Segmental and somatic dysfunction of lumbar region: Secondary | ICD-10-CM | POA: Diagnosis not present

## 2017-09-24 DIAGNOSIS — M9901 Segmental and somatic dysfunction of cervical region: Secondary | ICD-10-CM | POA: Diagnosis not present

## 2017-09-24 DIAGNOSIS — M9902 Segmental and somatic dysfunction of thoracic region: Secondary | ICD-10-CM | POA: Diagnosis not present

## 2017-09-29 DIAGNOSIS — I428 Other cardiomyopathies: Secondary | ICD-10-CM | POA: Diagnosis not present

## 2017-09-29 DIAGNOSIS — R0789 Other chest pain: Secondary | ICD-10-CM | POA: Diagnosis not present

## 2017-10-17 ENCOUNTER — Encounter (HOSPITAL_COMMUNITY): Payer: Self-pay | Admitting: Emergency Medicine

## 2017-10-17 DIAGNOSIS — R1033 Periumbilical pain: Secondary | ICD-10-CM | POA: Insufficient documentation

## 2017-10-17 DIAGNOSIS — R1011 Right upper quadrant pain: Secondary | ICD-10-CM | POA: Diagnosis not present

## 2017-10-17 DIAGNOSIS — R1031 Right lower quadrant pain: Secondary | ICD-10-CM | POA: Diagnosis not present

## 2017-10-17 DIAGNOSIS — Z79899 Other long term (current) drug therapy: Secondary | ICD-10-CM | POA: Insufficient documentation

## 2017-10-17 LAB — CBC
HCT: 38.7 % — ABNORMAL LOW (ref 39.0–52.0)
Hemoglobin: 12.6 g/dL — ABNORMAL LOW (ref 13.0–17.0)
MCH: 23.4 pg — AB (ref 26.0–34.0)
MCHC: 32.6 g/dL (ref 30.0–36.0)
MCV: 71.8 fL — ABNORMAL LOW (ref 78.0–100.0)
PLATELETS: 214 10*3/uL (ref 150–400)
RBC: 5.39 MIL/uL (ref 4.22–5.81)
RDW: 13.8 % (ref 11.5–15.5)
WBC: 5.4 10*3/uL (ref 4.0–10.5)

## 2017-10-17 LAB — COMPREHENSIVE METABOLIC PANEL
ALK PHOS: 65 U/L (ref 38–126)
ALT: 21 U/L (ref 17–63)
AST: 18 U/L (ref 15–41)
Albumin: 4.5 g/dL (ref 3.5–5.0)
Anion gap: 12 (ref 5–15)
BILIRUBIN TOTAL: 0.4 mg/dL (ref 0.3–1.2)
BUN: 11 mg/dL (ref 6–20)
CALCIUM: 9.5 mg/dL (ref 8.9–10.3)
CO2: 22 mmol/L (ref 22–32)
CREATININE: 0.94 mg/dL (ref 0.61–1.24)
Chloride: 109 mmol/L (ref 101–111)
GFR calc non Af Amer: >60 mL/min (ref >60)
GLUCOSE: 100 mg/dL — AB (ref 65–99)
Potassium: 3.4 mmol/L — ABNORMAL LOW (ref 3.5–5.1)
Sodium: 143 mmol/L (ref 135–145)
TOTAL PROTEIN: 7.5 g/dL (ref 6.5–8.1)

## 2017-10-17 LAB — URINALYSIS, ROUTINE W REFLEX MICROSCOPIC
BILIRUBIN URINE: NEGATIVE
Glucose, UA: NEGATIVE mg/dL
HGB URINE DIPSTICK: NEGATIVE
KETONES UR: 5 mg/dL — AB
Leukocytes, UA: NEGATIVE
NITRITE: NEGATIVE
PROTEIN: NEGATIVE mg/dL
Specific Gravity, Urine: 1.02 (ref 1.005–1.030)
pH: 6 (ref 5.0–8.0)

## 2017-10-17 LAB — LIPASE, BLOOD: Lipase: 25 U/L (ref 11–51)

## 2017-10-17 NOTE — ED Triage Notes (Signed)
Patient c/o intermittent abdominal pain x2 weeks and sudden sharp constant RLQ pain x50 minutes. Denies N/V/D. 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

## 2017-10-18 ENCOUNTER — Emergency Department (HOSPITAL_COMMUNITY): Payer: BLUE CROSS/BLUE SHIELD

## 2017-10-18 ENCOUNTER — Emergency Department (HOSPITAL_COMMUNITY)
Admission: EM | Admit: 2017-10-18 | Discharge: 2017-10-18 | Disposition: A | Discharge status: Home / Self Care | Payer: BLUE CROSS/BLUE SHIELD | Attending: Emergency Medicine | Admitting: Emergency Medicine

## 2017-10-18 ENCOUNTER — Encounter (HOSPITAL_COMMUNITY): Payer: Self-pay

## 2017-10-18 ENCOUNTER — Other Ambulatory Visit: Payer: Self-pay

## 2017-10-18 DIAGNOSIS — R1030 Lower abdominal pain, unspecified: Secondary | ICD-10-CM

## 2017-10-18 DIAGNOSIS — R1011 Right upper quadrant pain: Secondary | ICD-10-CM | POA: Diagnosis not present

## 2017-10-18 HISTORY — DX: Umbilical hernia without obstruction or gangrene: K42.9

## 2017-10-18 MED ORDER — IOPAMIDOL (ISOVUE-300) INJECTION 61%
INTRAVENOUS | Status: AC
  Filled 2017-10-18: qty 100

## 2017-10-18 MED ORDER — IOPAMIDOL (ISOVUE-300) INJECTION 61%
100.0000 mL | Freq: Once | INTRAVENOUS | Status: AC | PRN
  Administered 2017-10-18: 100 mL via INTRAVENOUS

## 2017-10-18 MED ORDER — SODIUM CHLORIDE 0.9 % IJ SOLN
INTRAMUSCULAR | Status: AC
  Filled 2017-10-18: qty 50

## 2017-10-18 NOTE — Discharge Instructions (Signed)
It was my pleasure taking care of you today!  ° °Fortunately, your lab work and imaging was very reassuring.  °At this time, there does not appear to be an acute, emergent cause for your abdominal pain. However, this does not mean that your abdominal pain may not become an emergency in the future. It is VERY important that you monitor your symptoms and return to the Emergency Department if you develop any of the following symptoms:  °The pain does not go away.  °You have a fever.  °You keep throwing up and can't keep fluids down.  °You pass bloody or black tarry stools.  °There is bright red blood in the stool. °You do not seem to be getting better.  °You have any questions or concerns.  °

## 2017-10-18 NOTE — ED Provider Notes (Signed)
[place]  [facility] Provider Note   CSN: [id number] Arrival date & time: 10/17/17  2220     History   Chief Complaint Chief Complaint  Patient presents with  . Abdominal Pain    HPI Shawn Garcia is a 22 y.o. male.  The history is provided by the patient and medical records. No language interpreter was used.    Shawn Garcia is a 21 y.o. male  with a PMH of ventral hernia who presents to the Emergency Department complaining of intermittent periumbilical abdominal pain x 2 weeks with acute onset of sharp, burning, cramping RLQ abdominal pain which began at approximately 10PM.  No medications taken prior to arrival for symptoms. No fevers, nausea, vomiting, diarrhea, constipation. No hx of similar. Denies sick contacts.   Past Medical History:  Diagnosis Date  . Cardiomyopathy (HCC)   . Umbilical hernia     Patient Active Problem List   Diagnosis Date Noted  . Ventral hernia without obstruction or gangrene 09/16/2017  . Warts 01/07/2015    History reviewed. No pertinent surgical history.      Home Medications    Prior to Admission medications   Medication Sig Start Date End Date Taking? Authorizing Provider  ibuprofen (ADVIL,MOTRIN) 200 MG tablet Take 400 mg by mouth every 6 (six) hours as needed for moderate pain.   Yes [provider]  losartan (COZAAR) 25 MG tablet Take by mouth. 03/03/17 03/03/18 Yes [provider]  metoprolol succinate (TOPROL-XL) 25 MG 24 hr tablet Take by mouth. 03/03/17 03/03/18 Yes [provider]    Family History No family history on file.  Social History Social History   Tobacco Use  . Smoking status: Never Smoker  . Smokeless tobacco: Never Used  Substance Use Topics  . Alcohol use: No    Comment: socially  . Drug use: No     Allergies   Patient has no known allergies.   Review of Systems Review of Systems  Gastrointestinal: Positive for abdominal pain. Negative  for blood in stool, constipation, diarrhea, nausea and vomiting.  All other systems reviewed and are negative.    Physical Exam Updated Vital Signs BP 118/73   Pulse (!) 56   Temp 97.8 F (36.6 C) (Oral)   Resp 16   Ht 5\' 7"  (1.702 m)   Wt 72.6 kg (160 lb)   SpO2 100%   BMI 25.06 kg/m   Physical Exam  Constitutional: He is oriented to person, place, and time. He appears well-developed and well-nourished. No distress.  HENT:  Head: Normocephalic and atraumatic.  Cardiovascular: Normal rate, regular rhythm and normal heart sounds.  No murmur heard. Pulmonary/Chest: Effort normal and breath sounds normal. No respiratory distress.  Abdominal: Soft. Bowel sounds are normal. He exhibits no distension.  Diffuse tenderness to the periumbilical area and lower abdomen with no rebound or guarding.   Musculoskeletal: Normal range of motion.  Neurological: He is alert and oriented to person, place, and time.  Skin: Skin is warm and dry.  Nursing note and vitals reviewed.    ED Treatments / Results  Labs (all labs ordered are listed, but only abnormal results are displayed) Labs Reviewed  COMPREHENSIVE METABOLIC PANEL - Abnormal; Notable for the following components:      Result Value   Potassium 3.4 (*)    Glucose, Bld 100 (*)    All other components within normal limits  CBC - Abnormal; Notable for the following components:   Hemoglobin 12.6 (*)  HCT 38.7 (*)    MCV 71.8 (*)    MCH 23.4 (*)    All other components within normal limits  URINALYSIS, ROUTINE W REFLEX MICROSCOPIC - Abnormal; Notable for the following components:   Ketones, ur 5 (*)    All other components within normal limits  LIPASE, BLOOD    EKG None  Radiology Ct Abdomen Pelvis W Contrast  Result Date: 10/18/2017 CLINICAL DATA:  22 year old male with right upper quadrant abdominal pain. EXAM: CT ABDOMEN AND PELVIS WITH CONTRAST TECHNIQUE: Multidetector CT imaging of the abdomen and pelvis was  performed using the standard protocol following bolus administration of intravenous contrast. CONTRAST:  ISOVUE-300 IOPAMIDOL (ISOVUE-300) INJECTION 61% COMPARISON:  Abdominal CT dated 12/08/2016 FINDINGS: Lower chest: The visualized lung bases are clear. No intra-abdominal free air or free fluid. Hepatobiliary: No focal liver abnormality is seen. No gallstones, gallbladder wall thickening, or biliary dilatation. Pancreas: Unremarkable. No pancreatic ductal dilatation or surrounding inflammatory changes. Spleen: Normal in size without focal abnormality. Adrenals/Urinary Tract: Adrenal glands are unremarkable. Kidneys are normal, without renal calculi, focal lesion, or hydronephrosis. Bladder is unremarkable. Stomach/Bowel: There is moderate stool throughout the colon. There is no bowel obstruction or active inflammation. Normal appendix. Vascular/Lymphatic: No significant vascular findings are present. No enlarged abdominal or pelvic lymph nodes. Reproductive: The prostate and seminal vesicles are grossly unremarkable. Other: Probable prior umbilical hernia repair. Musculoskeletal: No acute or significant osseous findings. IMPRESSION: No acute intra-abdominopelvic pathology. No bowel obstruction or active inflammation. Normal appendix. Electronically Signed   By: Elgie Collard M.D.   On: 10/18/2017 04:39    Procedures Procedures (including critical care time)  Medications Ordered in ED Medications  sodium chloride 0.9 % injection (has no administration in time range)  iopamidol (ISOVUE-300) 61 % injection 100 mL (100 mLs Intravenous Contrast Given 10/18/17 0413)     Initial Impression / Assessment and Plan / ED Course  I have reviewed the triage vital signs and the nursing notes.  Pertinent labs & imaging results that were available during my care of the patient were reviewed by me and considered in my medical decision making (see chart for details).    Shawn Garcia is a 22 y.o. male  who presents to ED for lower abdominal pain. Hx of ventral hernia without repair. Tender to periumbilical region and RLQ on exam. No peritoneal signs. Patient is nontoxic, nonseptic appearing. Labs reviewed and reassuring. CT without acute findings. Patient does not meet the SIRS or Sepsis criteria. On repeat exam, abdominal exam with no peritoneal signs. No indication of appendicitis, bowel obstruction, bowel perforation, cholecystitis, diverticulitis. Patient discharged home with symptomatic treatment and encouraged to follow up with PCP. I have also discussed reasons to return immediately to the ER. Patient expresses understanding and agrees with plan as dictated above.  Final Clinical Impressions(s) / ED Diagnoses   Final diagnoses:  Lower abdominal pain    ED Discharge Orders    None       Heavan Francom, Chase Picket, PA-C 10/18/17 0510    Devoria Albe, MD 10/18/17 [phone]

## 2017-10-19 ENCOUNTER — Encounter: Payer: Self-pay | Admitting: Internal Medicine

## 2017-10-31 ENCOUNTER — Ambulatory Visit: Payer: Self-pay | Admitting: Internal Medicine

## 2017-10-31 ENCOUNTER — Encounter: Payer: Self-pay | Admitting: Internal Medicine

## 2017-10-31 VITALS — BP 112/72 | HR 64 | Ht 67.0 in | Wt 158.0 lb

## 2017-10-31 DIAGNOSIS — R1033 Periumbilical pain: Secondary | ICD-10-CM

## 2017-10-31 NOTE — Progress Notes (Signed)
Shawn Garcia 22 y.o. [birth year] [id number]  Assessment & Plan:   Encounter Diagnosis  Name Primary?  . Periumbilical abdominal pain Yes   I cannot identify a hernia on physical exam nor can I see one on imaging.  There seems to be a bit of a small defect in the midline just above the umbilicus but no hernia.  Question if that is related to his prior hernia repair perhaps.  I reassured him as best I can.  I talked about considering surgical referral so that they may evaluate him though it would be best to see the hernia if present.  So far I do not think we have good objective evidence of a ventral hernia its by history.  I told him to resume his normal activities, if he develops signs of an incarcerated hernia then he should go to the emergency department and I have given him a hernia information sheet.  If he has a recurrence he can be referred to surgery.  He did not want to go to a surgical appointment at this time.  I appreciate the opportunity to care for this patient.    Subjective:   Chief Complaint: Abdominal pain and question of hernia  HPI The patient is a 22 year old black man here because of abdominal pain and question of hernia, he was seen in March by Dr. Jordan Garcia of primary care with a question of a hernia, it appears that the patient had childhood repair of an umbilical hernia and then lately has noticed a bulge that is reducible and somewhat tender but mildly so above the umbilicus.  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  When he saw Dr. Jordan Garcia, an abdominal ultrasound or an abdominal wall ultrasound was done and there was a question of a "defect" on the ultrasound though on previous imaging he had never had a hernia identified.  The patient subsequently had some burning and sharp abdominal pains after a workout and wrestling as part of his martial arts on April 2 where he went to the emergency  room where he had a negative CT the abdomen and pelvis without hernia or any type of abnormality.  I have reviewed the images with the patient.  He is quite concerned in any type of activities he does physically with respect to the martial arts may cause harm.  He clearly describes something protruding and reducing it, just above the umbilicus.  It is not a problem now and he feels well now. No Known Allergies Current Meds  Medication Sig  . albuterol (PROVENTIL HFA;VENTOLIN HFA) 108 (90 Base) MCG/ACT inhaler Inhale into the lungs every 6 (six) hours as needed for wheezing or shortness of breath.  Marland Kitchen ibuprofen (ADVIL,MOTRIN) 200 MG tablet Take 400 mg by mouth every 6 (six) hours as needed for moderate pain.  Marland Kitchen losartan (COZAAR) 25 MG tablet Take by mouth.  . metoprolol succinate (TOPROL-XL) 25 MG 24 hr tablet Take by mouth.   Past Medical History:  Diagnosis Date  . Cardiomyopathy (HCC)   . Umbilical hernia    Past Surgical History:  Procedure Laterality Date  . UMBILICAL HERNIA REPAIR     childhood he thinks and CT shows changes   Social History   Social History Narrative   Single, no kids   EtOH 2x/week   No tobacco or drug use   Working at [employer]   Also a Comptroller  family history includes Diabetes in his maternal grandfather and maternal grandmother.   Review of Systems All other negative  Objective:   Physical Exam BP 112/72   Pulse 64   Ht 5\' 7"  (1.702 m)   Wt 158 lb (71.7 kg)   BMI 24.75 kg/m  NAD young black man Abdomen soft and non-tender with and without bilateral leg lift and abdominal wall contraction. No HSM/mass and no hernia  There is a small defect in the midline with thin tissues just above the umbilicus but no hernia  Alert and oriented x 3 Mildly anxious about his health complaints

## 2017-10-31 NOTE — Patient Instructions (Signed)
Per Dr Leone Payor you may resume your normal activities.  If you have more problems you may need to see a Careers adviser. [facility]) There phone # is [phone].   Follow up with Dr Leone Payor as needed.     I appreciate the opportunity to care for you. Stan Head, MD, [facility]

## 2017-11-24 ENCOUNTER — Telehealth: Payer: Self-pay | Admitting: Internal Medicine

## 2017-11-24 NOTE — Telephone Encounter (Signed)
Left message for patient that will fax over referral to [facility]. Faxed to [phone], any physician.

## 2017-11-24 NOTE — Telephone Encounter (Signed)
[facility] - Any MD there  Indication:  Periumbilical abdominal pain, ? hernia

## 2017-11-24 NOTE — Telephone Encounter (Signed)
Please advise 

## 2017-11-24 NOTE — Telephone Encounter (Signed)
Patient states when he was last seen for ov on 4.15.19 Dr.Gessner suggested referring pt to surgeon on hernia. Pt wants to know if he can be referred and if so if Dr.Gessner has a suggested doc he wants pt to see.

## 2017-11-28 ENCOUNTER — Encounter: Payer: Self-pay | Admitting: Family Medicine

## 2017-11-28 ENCOUNTER — Ambulatory Visit: Payer: BLUE CROSS/BLUE SHIELD | Admitting: Family Medicine

## 2017-11-28 VITALS — BP 102/86 | HR 65 | Temp 98.6°F | Wt 157.2 lb

## 2017-11-28 DIAGNOSIS — R109 Unspecified abdominal pain: Secondary | ICD-10-CM | POA: Insufficient documentation

## 2017-11-28 DIAGNOSIS — D649 Anemia, unspecified: Secondary | ICD-10-CM | POA: Diagnosis not present

## 2017-11-28 LAB — FERRITIN: FERRITIN: 72.7 ng/mL (ref 22.0–322.0)

## 2017-11-28 LAB — CBC
HCT: 40.7 % (ref 39.0–52.0)
HEMOGLOBIN: 13.2 g/dL (ref 13.0–17.0)
MCHC: 32.3 g/dL (ref 30.0–36.0)
MCV: 72.4 fl — ABNORMAL LOW (ref 78.0–100.0)
Platelets: 216 10*3/uL (ref 150.0–400.0)
RBC: 5.62 Mil/uL (ref 4.22–5.81)
RDW: 14.3 % (ref 11.5–15.5)
WBC: 3.2 10*3/uL — AB (ref 4.0–10.5)

## 2017-11-28 LAB — SEDIMENTATION RATE: Sed Rate: 6 mm/hr (ref 0–15)

## 2017-11-28 LAB — C-REACTIVE PROTEIN: CRP: <0.1 mg/dL — ABNORMAL LOW (ref 0.5–20.0)

## 2017-11-28 MED ORDER — PANTOPRAZOLE SODIUM 40 MG PO TBEC: 40.0000 mg | DELAYED_RELEASE_TABLET | Freq: Two times a day (BID) | ORAL | 1 refills | Status: DC

## 2017-11-28 NOTE — Assessment & Plan Note (Signed)
New problem Abdominal pain different than prior abdominal pain, more epigastric Concern for gastritis/ulcer given frequent NSAID use, epigastric pain and anemia Limit NSAID use Start protonix Recheck CBC Will also check inflammatory markers with involvement of several areas in the past, although inflammatory bowel disease is lower in differential.  Regarding prior abdominal pain: Negative CT and ultrasound previously Has been seen by GI, recommending he see surgery for possible hernia.

## 2017-11-28 NOTE — Progress Notes (Signed)
Shawn Garcia - 22 y.o. male MRN [id number]  Date of birth: [birth year]  Subjective Chief Complaint  Patient presents with  . Abdominal Pain    HPI Shawn Garcia is a 22 y.o. male here today with complaint of abdominal pain.  He has a history of abdominal pain dating back a little over one year.  Prior abdominal pain had been periumbilical and associated with protrusion through the abdominal wall, concerning for hernia.  He had [place] and CT scan of abdomen at that time that did not show any abnormality.  Was recently seen by Dr. Jordan Likes for similar concern and had repeat US showing possible abdominal wall defect without obvious hernia.  He was also seen in the ED for abdominal pain and had lab work and repeat CT scan.  Imaging was unremarkable however his CBC showed some mild anemia.  He describes his current abdominal pain as epigastric with some intermittent RLQ pain.  He tells me that his current pain is different than previous pain and began a few weeks ago after wrestling with a friend.  Pain was sharp, band like around entire abdomen.  He started taking ibuprofen for this and this type of pain seemed to resolve.  He has continued taking 800mg  ibuprofen as well as occasional aleve on a daily basis and began having the epigastric pain with intermittent RLQ pain.  RLQ pain is made worse with movement such as sitting up. He does report some occasional constipation and frequent bloating feeling.  Has not noticed that any particular foods make this worse. He denies fever, chills, melena, hematochezia, diarrhea, nausea or vomiting but does limit what he eats due to abdominal discomfort.  He has no family history of early colon cancer or inflammatory bowel disease.   ROS: ROS completed and negative except as noted per HPI   No Known Allergies  Past Medical History:  Diagnosis Date  . Cardiomyopathy (HCC)   . Umbilical hernia     Past Surgical History:  Procedure Laterality Date  . UMBILICAL  HERNIA REPAIR     childhood he thinks and CT shows changes    Social History   Socioeconomic History  . Marital status: Single    Spouse name: Not on file  . Number of children: Not on file  . Years of education: Not on file  . Highest education level: Not on file  Occupational History    Employer: [employer]  Social Needs  . Financial resource strain: Not on file  . Food insecurity:    Worry: Not on file    Inability: Not on file  . Transportation needs:    Medical: Not on file    Non-medical: Not on file  Tobacco Use  . Smoking status: Never Smoker  . Smokeless tobacco: Never Used  Substance and Sexual Activity  . Alcohol use: Yes    Comment: socially, 2x a week  . Drug use: No  . Sexual activity: Never  Lifestyle  . Physical activity:    Days per week: Not on file    Minutes per session: Not on file  . Stress: Not on file  Relationships  . Social connections:    Talks on phone: Not on file    Gets together: Not on file    Attends religious service: Not on file    Active member of club or organization: Not on file    Attends meetings of clubs or organizations: Not on file    Relationship status:  Not on file  Other Topics Concern  . 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 Diabetes Maternal Grandmother   . Diabetes Maternal Grandfather     Health Maintenance  Topic Date Due  . HIV Screening  12/02/2009  . TETANUS/TDAP  12/02/2013  . INFLUENZA VACCINE  02/16/2018    ----------------------------------------------------------------------------------------------------------------------------------------------------------------------------------------------------------------- Physical Exam BP 102/86 (BP Location: Right Arm, Patient Position: Sitting, Cuff Size: Normal)   Pulse 65   Temp 98.6  F (37 C) (Oral)   Wt 157 lb 3.2 oz (71.3 kg)   BMI 24.62 kg/m   Physical Exam  Constitutional: He is oriented to person, place, and time. He appears well-nourished. No distress.  HENT:  Head: Normocephalic and atraumatic.  Mouth/Throat: No oropharyngeal exudate.  Eyes: No scleral icterus.  Cardiovascular: Normal rate and regular rhythm.  Pulmonary/Chest: Effort normal and breath sounds normal.  Abdominal: Soft. Normal appearance and bowel sounds are normal. There is no splenomegaly. There is tenderness in the epigastric area. There is no tenderness at McBurney's point and negative Murphy's sign. No hernia. Hernia confirmed negative in the right inguinal area and confirmed negative in the left inguinal area.  Also with mild ttp RLQ, adjacent to umbilicus. Negative rosving sign.   Genitourinary: Testes normal and penis normal. Right testis shows no mass, no swelling and no tenderness. Left testis shows no mass, no swelling and no tenderness.  Neurological: He is alert and oriented to person, place, and time.  Skin: No rash noted.  Psychiatric: He has a normal mood and affect. His behavior is normal.    ------------------------------------------------------------------------------------------------------------------------------------------------------------------------------------------------------------------- Assessment and Plan  Abdominal pain New problem Abdominal pain different than prior abdominal pain, more epigastric Concern for gastritis/ulcer given frequent NSAID use, epigastric pain and anemia Limit NSAID use Start protonix Recheck CBC Will also check inflammatory markers with involvement of several areas in the past, although inflammatory bowel disease is lower in differential.  Regarding prior abdominal pain: Negative CT and ultrasound previously Has been seen by GI, recommending he see surgery for possible hernia.

## 2017-11-28 NOTE — Patient Instructions (Signed)
Peptic Ulcer  A peptic ulcer is a sore in the lining of the esophagus (esophageal ulcer), the stomach (gastric ulcer), or the first part of the small intestine (duodenal ulcer). The ulcer causes gradual wearing away (erosion) into the deeper tissue.  What are the causes?  Normally, the lining of the stomach and the small intestine protects itself from the acid that digests food. The protective lining can be damaged by:   An infection caused by a germ (bacterium) called Helicobacter pylori or H. pylori.   Regular use of NSAIDs, such as ibuprofen or aspirin.   Rare tumors in the stomach, small intestine, or pancreas (Zollinger-Ellison syndrome).    What increases the risk?  The following factors may make you more likely to develop this condition:   Smoking.   Having a family history of ulcer disease.    What are the signs or symptoms?  Symptoms of this condition include:   Burning pain or gnawing in the area between the chest and the belly button. The pain may be worse on an empty stomach and at night.   Heartburn.   Nausea and vomiting.   Bloating.    If the ulcer results in bleeding, it can cause:   Black, tarry stools.   Vomiting of bright red blood.   Vomiting of material that looks like coffee grounds.    How is this diagnosed?  This condition may be diagnosed based on:   Medical history and physical exam.   Various tests or procedures, such as:  ? Blood tests, stool tests, or breath tests to check for the H. pylori bacterium.  ? An X-ray exam (upper gastrointestinal series) of the esophagus, stomach, and small intestine.  ? Upper endoscopy. The health care provider examines the esophagus, stomach, and small intestine using a small flexible tube that has a video camera at the end.  ? Biopsy. A tissue sample is removed to be examined under a microscope.    How is this treated?  Treatment for this condition may include:   Eliminating the cause of the ulcer, such as smoking or the use of NSAIDs or  alcohol.   Medicines to reduce the amount of acid in your digestive tract.   Antibiotic medicines, if the ulcer is caused by the H. pylori bacterium.   An upper endoscopy to treat a bleeding ulcer.   Surgery, if the bleeding is severe or if the ulcer created a hole somewhere in the digestive system.    Follow these instructions at home:   Avoid alcohol and caffeine.   Do not use any tobacco products, such as cigarettes, chewing tobacco, and e-cigarettes. If you need help quitting, ask your health care provider.   Take over-the-counter and prescription medicines only as told by your health care provider. Do not use over-the-counter medicines in place of prescription medicines unless your health care provider approves.   Keep all follow-up visits as told by your health care provider. This is important.  Contact a health care provider if:   Your symptoms do not improve within 7 days of starting treatment.   You have ongoing indigestion or heartburn.  Get help right away if:   You have sudden, sharp, or persistent pain in your abdomen.   You have bloody or dark black, tarry stools.   You vomit blood or material that looks like coffee grounds.   You become light-headed or you feel faint.   You become weak.   You become sweaty or clammy.    This information is not intended to replace advice given to you by your health care provider. Make sure you discuss any questions you have with your health care provider.  Document Released: 07/02/2000 Document Revised: 12/08/2015 Document Reviewed: 04/05/2015  Elsevier Interactive Patient Education  2018 [employer]

## 2017-12-02 ENCOUNTER — Other Ambulatory Visit: Payer: Self-pay | Admitting: Surgery

## 2017-12-02 DIAGNOSIS — R1011 Right upper quadrant pain: Secondary | ICD-10-CM

## 2017-12-02 DIAGNOSIS — K429 Umbilical hernia without obstruction or gangrene: Secondary | ICD-10-CM | POA: Diagnosis not present

## 2017-12-22 ENCOUNTER — Encounter (HOSPITAL_COMMUNITY): Payer: Self-pay | Admitting: Emergency Medicine

## 2017-12-22 ENCOUNTER — Ambulatory Visit: Payer: Self-pay

## 2017-12-22 ENCOUNTER — Emergency Department (HOSPITAL_COMMUNITY)
Admission: EM | Admit: 2017-12-22 | Discharge: 2017-12-22 | Disposition: A | Discharge status: Home / Self Care | Payer: BLUE CROSS/BLUE SHIELD | Attending: Emergency Medicine | Admitting: Emergency Medicine

## 2017-12-22 ENCOUNTER — Emergency Department (HOSPITAL_COMMUNITY): Payer: BLUE CROSS/BLUE SHIELD

## 2017-12-22 DIAGNOSIS — Z79899 Other long term (current) drug therapy: Secondary | ICD-10-CM | POA: Diagnosis not present

## 2017-12-22 DIAGNOSIS — R042 Hemoptysis: Secondary | ICD-10-CM | POA: Diagnosis not present

## 2017-12-22 DIAGNOSIS — R079 Chest pain, unspecified: Secondary | ICD-10-CM | POA: Diagnosis not present

## 2017-12-22 DIAGNOSIS — R1013 Epigastric pain: Secondary | ICD-10-CM | POA: Diagnosis not present

## 2017-12-22 LAB — BASIC METABOLIC PANEL
Anion gap: 8 (ref 5–15)
BUN: 7 mg/dL (ref 6–20)
CALCIUM: 9.5 mg/dL (ref 8.9–10.3)
CO2: 28 mmol/L (ref 22–32)
CREATININE: 0.84 mg/dL (ref 0.61–1.24)
Chloride: 107 mmol/L (ref 101–111)
GFR calc non Af Amer: >60 mL/min (ref >60)
GLUCOSE: 93 mg/dL (ref 65–99)
Potassium: 3.8 mmol/L (ref 3.5–5.1)
Sodium: 143 mmol/L (ref 135–145)

## 2017-12-22 LAB — TYPE AND SCREEN
ABO/RH(D): A POS
Antibody Screen: NEGATIVE

## 2017-12-22 LAB — ABO/RH: ABO/RH(D): A POS

## 2017-12-22 LAB — CBC
HCT: 40.8 % (ref 39.0–52.0)
Hemoglobin: 13.2 g/dL (ref 13.0–17.0)
MCH: 23.7 pg — AB (ref 26.0–34.0)
MCHC: 32.4 g/dL (ref 30.0–36.0)
MCV: 73.4 fL — ABNORMAL LOW (ref 78.0–100.0)
PLATELETS: 238 10*3/uL (ref 150–400)
RBC: 5.56 MIL/uL (ref 4.22–5.81)
RDW: 13.8 % (ref 11.5–15.5)
WBC: 4.9 10*3/uL (ref 4.0–10.5)

## 2017-12-22 LAB — POC OCCULT BLOOD, ED: Fecal Occult Bld: NEGATIVE

## 2017-12-22 LAB — I-STAT TROPONIN, ED: TROPONIN I, POC: 0 ng/mL (ref 0.00–0.08)

## 2017-12-22 MED ORDER — GI COCKTAIL ~~LOC~~
30.0000 mL | Freq: Once | ORAL | Status: AC
Start: 2017-12-22 — End: 2017-12-22
  Administered 2017-12-22: 30 mL via ORAL
  Filled 2017-12-22: qty 30

## 2017-12-22 NOTE — Telephone Encounter (Signed)
FYI

## 2017-12-22 NOTE — ED Provider Notes (Signed)
[facility]-EMERGENCY DEPT Provider Note   CSN: [id number] Arrival date & time: 12/22/17  1335     History   Chief Complaint Chief Complaint  Patient presents with  . Chest Pain  . spitting up blood  . Rectal Bleeding    HPI Shawn Garcia is a 22 y.o. male.  Pt presents to the ED today with burning abdominal pain.  He has been spitting up some blood and has seen some [facility].  The pt has had abdominal pain on and off for months.  He saw his pcp on 5/13 and was started on protonix.  Recent abdominal ct and US showed nothing acute.  The pt said he's been feeling better since starting the protonix, but he started feeling bad again today.  The pt denies any n/v.  No f/c.  The pt is scheduled to see [facility] in a few weeks.     Past Medical History:  Diagnosis Date  . Cardiomyopathy (HCC)   . Umbilical hernia     Patient Active Problem List   Diagnosis Date Noted  . Abdominal pain 11/28/2017  . Anemia 11/28/2017  . Ventral hernia without obstruction or gangrene 09/16/2017  . Warts 01/07/2015    Past Surgical History:  Procedure Laterality Date  . UMBILICAL HERNIA REPAIR     childhood he thinks and CT shows changes        Home Medications    Prior to Admission medications   Medication Sig Start Date End Date Taking? Authorizing Provider  aspirin EC 81 MG tablet Take 81 mg by mouth daily as needed (migraine).   Yes [provider]  calcium carbonate (TUMS - DOSED IN MG ELEMENTAL CALCIUM) 500 MG chewable tablet Chew 1 tablet by mouth 2 (two) times daily as needed for indigestion or heartburn.   Yes [provider]  losartan (COZAAR) 25 MG tablet Take 25 mg by mouth daily.  03/03/17 03/03/18 Yes [provider]  metoprolol succinate (TOPROL-XL) 25 MG 24 hr tablet Take 25 mg by mouth daily.  03/03/17 03/03/18 Yes [provider]  pantoprazole (PROTONIX) 40 MG tablet Take 1 tablet (40 mg total) by mouth 2 (two) times  daily. 11/28/17  Yes Everrett Coombe, DO  albuterol (PROVENTIL HFA;VENTOLIN HFA) 108 (90 Base) MCG/ACT inhaler Inhale into the lungs every 6 (six) hours as needed for wheezing or shortness of breath.    [provider]    Family History Family History  Problem Relation Age of Onset  . Diabetes Maternal Grandmother   . Diabetes Maternal Grandfather     Social History Social History   Tobacco Use  . Smoking status: Never Smoker  . Smokeless tobacco: Never Used  Substance Use Topics  . Alcohol use: Yes  . Drug use: No     Allergies   Patient has no known allergies.   Review of Systems Review of Systems  Gastrointestinal: Positive for abdominal pain and blood in stool.  All other systems reviewed and are negative.    Physical Exam Updated Vital Signs BP 127/84 (BP Location: Left Arm)   Pulse 67   Temp 98.5 F (36.9 C) (Oral)   Resp 12   Ht 5\' 7"  (1.702 m)   Wt 72.6 kg (160 lb)   SpO2 98%   BMI 25.06 kg/m   Physical Exam  Constitutional: He is oriented to person, place, and time. He appears well-developed and well-nourished.  HENT:  Head: Normocephalic and atraumatic.  Eyes: Pupils are  equal, round, and reactive to light. EOM are normal.  Neck: Normal range of motion. Neck supple.  Cardiovascular: Normal rate, regular rhythm, intact distal pulses and normal pulses.  Pulmonary/Chest: Effort normal and breath sounds normal.  Abdominal: Soft. There is tenderness in the epigastric area.  Genitourinary: Rectal exam shows no external hemorrhoid, no tenderness and guaiac negative stool.  Musculoskeletal: Normal range of motion.       Right lower leg: Normal.       Left lower leg: Normal.  Neurological: He is alert and oriented to person, place, and time.  Skin: Skin is warm. Capillary refill takes less than 2 seconds.  Psychiatric: He has a normal mood and affect. His behavior is normal.  Nursing note and vitals reviewed.    ED Treatments / Results   Labs (all labs ordered are listed, but only abnormal results are displayed) Labs Reviewed  CBC - Abnormal; Notable for the following components:      Result Value   MCV 73.4 (*)    MCH 23.7 (*)    All other components within normal limits  BASIC METABOLIC PANEL  I-STAT TROPONIN, ED  POC OCCULT BLOOD, ED  TYPE AND SCREEN  ABO/RH    EKG None  Radiology Dg Chest 2 View  Result Date: 12/22/2017 CLINICAL DATA:  Hemoptysis for 2 days. EXAM: CHEST - 2 VIEW COMPARISON:  04/21/2016 and prior chest radiograph FINDINGS: The cardiomediastinal silhouette is unremarkable. There is no evidence of focal airspace disease, pulmonary edema, suspicious pulmonary nodule/mass, pleural effusion, or pneumothorax. No acute bony abnormalities are identified. IMPRESSION: No active cardiopulmonary disease. Electronically Signed   By: Harmon Pier M.D.   On: 12/22/2017 14:09    Procedures Procedures (including critical care time)  Medications Ordered in ED Medications  gi cocktail (Maalox,Lidocaine,Donnatal) (30 mLs Oral Given 12/22/17 1756)     Initial Impression / Assessment and Plan / ED Course  I have reviewed the triage vital signs and the nursing notes.  Pertinent labs & imaging results that were available during my care of the patient were reviewed by me and considered in my medical decision making (see chart for details).    Pt likely has GERD.  We talked about diet and other ways to help stop the burning pain.  He knows to continue with his protonix and to f/u with GI as scheduled.  Final Clinical Impressions(s) / ED Diagnoses   Final diagnoses:  Epigastric pain    ED Discharge Orders    None       Jacalyn Lefevre, MD 12/22/17 [phone]

## 2017-12-22 NOTE — ED Triage Notes (Signed)
Pt c/o chest pains that have been intermittent since earlier this week.  Pt reports tasting blood in mouth and when he spits notices blood. Noticed dark stool today as well. Hx cardiomyopathy and ulcers.

## 2017-12-22 NOTE — Telephone Encounter (Signed)
Pt. called to report he has "spit up blood about 5-6 times today.  Described that he had a funny taste in mouth, and when he spit it out there was bright red streaks in the clear fluid.  Reported the blood was "less than a teaspoon."  Denied any sore gums or bad teeth in mouth.  C/o right lower quad. Abdominal pain just below level of umbilicus.  Described as a "warm and burning sensation."   Rated the discomfort at a "3/10."  Denied abdominal distension.  Denied vomiting, and stated he isn't sure if he is nauseated, because he just doesn't feel good overall.  Reported hx of stomach ulcer.  Reported feeling better over past 2 weeks, and then started not  feeling good, since Monday.  C/o "not getting much sleep, feeling very tired, having lower back aching and soreness."  Also, reported left ant. chest is "tight".  Reported he hasn't had a desire to eat much.  Stated he had "heartburn" yesterday.  Voiced concern of having gotten off schedule with his heart medication, in the past week.  About 35 minutes spent discussing his symptoms and health concerns.  Advised pt. He should go to the ER with spitting up bright red blood and recurrence of abdominal pain.   Pt. Questioned if this was necessary?  Advised the hospital is able to evaluate the source of bleeding.  Pt. Verb. Understanding.  Agreed.     Reason for Disposition . [1] Vomited blood AND [2] more than a few streaks of blood    (Exception: few streaks and only occurred once)    (Exception: swallowed blood from a nosebleed or cut in the mouth)  Answer Assessment - Initial Assessment Questions 1. APPEARANCE of BLOOD: "What does the blood look like?" (e.g., color, coffee-grounds) Spitting out bright blood ; tastes it in mouth     2. AMOUNT: "How much blood was lost?"     Less than teaspoon, approx. 5-6 times  3. VOMITING BLOOD: "How many times did it happen?" or "How many times in the past 24 hours?"     Spitting out bright red blood  4. VOMITING WITHOUT  BLOOD: "How many times in the past 24 hours?"      No  5. ONSET: "When did vomiting of blood begin?"     This morning several times 6. CAUSE: "What do you think is causing the vomiting of blood?"     Unsure; if this is related to recent stomach  7. BLOOD THINNERS: "Do you take any blood thinners?" (e.g., Coumadin/warfarin, Pradaxa/dabigatran, aspirin)     Unsure; started to not feel well since Monday 8. DEHYDRATION: "Are there any signs of dehydration?" "When was the last time you urinated?" "Do you feel dizzy?"     Can't really tell about dizziness; am extremely tired; stays hydrated 9. ABDOMINAL PAIN: "Are you having any abdominal pain?" If yes: "What does it feel like? " (e.g., crampy, dull, intermittent, constant)      C/o right lower quadrant pain that feels warm and burning 10. DIARRHEA: "Is there any diarrhea?" If so, ask: "How many times today?"       Denied  11. OTHER SYMPTOMS: "Do you have any other symptoms?" (e.g., fever, blood in stool)       Feeling feverish; lack of sleep; more tired, achy and sore in back, c/o a squeezing and tightness in left ant. chest ; if breathes more deeply, feels the squeezing ; has gotten off sched. with heart medication  missed.  Protocols used: VOMITING BLOOD-A-AH

## 2017-12-30 ENCOUNTER — Ambulatory Visit: Payer: BLUE CROSS/BLUE SHIELD | Admitting: Family Medicine

## 2018-01-02 ENCOUNTER — Encounter: Payer: Self-pay | Admitting: Family Medicine

## 2018-01-02 ENCOUNTER — Ambulatory Visit (INDEPENDENT_AMBULATORY_CARE_PROVIDER_SITE_OTHER): Payer: BLUE CROSS/BLUE SHIELD | Admitting: Family Medicine

## 2018-01-02 VITALS — BP 106/68 | HR 66 | Temp 98.1°F | Ht 67.0 in | Wt 153.0 lb

## 2018-01-02 DIAGNOSIS — R109 Unspecified abdominal pain: Secondary | ICD-10-CM | POA: Diagnosis not present

## 2018-01-02 NOTE — Assessment & Plan Note (Signed)
Pain has improved, no longer taking protonix Instructed that he may use this as needed if having increased symptoms He may also have an IBS component contributing to his pain given worsening cramping sensation when under stress He does also have a HIDA scan ordered by general surgery to evaluate the gallbladder.

## 2018-01-02 NOTE — Patient Instructions (Addendum)
Glad you are doing better! Take protonix as needed for upper abdomen pain/reflux symptoms

## 2018-01-02 NOTE — Progress Notes (Addendum)
Shawn Garcia - 22 y.o. male MRN [id number]  Date of birth: Dec 07, 1995  Subjective Chief Complaint  Patient presents with  . Follow-up    1 mo fu/abd pain is better--still has a little discomfort. went to ED on 12/22/17 for abd pain--lifting boxes. denied tdap    HPI Shawn Garcia is a 22 y.o. male here today for follow up of abdominal pain.  He reports that pain has improved significantly.  He was seen in ED since last seeing me for abdominal pain after picking up some heavy boxes.  Started protonix and took for about two weeks and had improvement.   Has not needed to take this since that time.  He agrees that excess NSAID use and stress seemed to be contributing to symptoms and he still gets some occasional abdominal cramping sensation if under a lot of stress.  He has also changed some of his eating habits which seems to help as well.  He has not had any changes to his bowel movements ecently and denies nausea or vomiting or fevers.    ROS: ROS completed and negative except as noted per HPI  No Known Allergies  Past Medical History:  Diagnosis Date  . Cardiomyopathy (HCC)   . Umbilical hernia     Past Surgical History:  Procedure Laterality Date  . UMBILICAL HERNIA REPAIR     childhood he thinks and CT shows changes    Social History   Socioeconomic History  . Marital status: Single    Spouse name: Not on file  . Number of children: Not on file  . Years of education: Not on file  . Highest education level: Not on file  Occupational History    Employer: [employer]  Social Needs  . Financial resource strain: Not on file  . Food insecurity:    Worry: Not on file    Inability: Not on file  . Transportation needs:    Medical: Not on file    Non-medical: Not on file  Tobacco Use  . Smoking status: Never Smoker  . Smokeless tobacco: Never Used  Substance and Sexual Activity  . Alcohol use: Yes  . Drug use: No  . Sexual activity: Never  Lifestyle  . Physical  activity:    Days per week: Not on file    Minutes per session: Not on file  . Stress: Not on file  Relationships  . Social connections:    Talks on phone: Not on file    Gets together: Not on file    Attends religious service: Not on file    Active member of club or organization: Not on file    Attends meetings of clubs or organizations: Not on file    Relationship status: Not on file  Other Topics Concern  . 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 Diabetes Maternal Grandmother   . Diabetes Maternal Grandfather     Health Maintenance  Topic Date Due  . HIV Screening  12/02/2009  . TETANUS/TDAP  12/02/2013  . INFLUENZA VACCINE  02/16/2018    ----------------------------------------------------------------------------------------------------------------------------------------------------------------------------------------------------------------- Physical Exam BP 106/68   Pulse 66   Temp 98.1 F (36.7 C) (Oral)   Ht 5\' 7"  (1.702 m)   Wt 153 lb (  69.4 kg)   SpO2 99%   BMI 23.96 kg/m   Physical Exam  Constitutional: He appears well-nourished. No distress.  HENT:  Head: Normocephalic and atraumatic.  Mouth/Throat: Oropharynx is clear and moist.  Eyes: No scleral icterus.  Neck: Neck supple. No thyromegaly present.  Cardiovascular: Normal rate, regular rhythm and normal heart sounds.  Pulmonary/Chest: Effort normal and breath sounds normal.  Abdominal: Soft. Bowel sounds are normal. He exhibits no distension. There is no tenderness. There is no guarding.  Psychiatric: He has a normal mood and affect. His behavior is normal.     ------------------------------------------------------------------------------------------------------------------------------------------------------------------------------------------------------------------- Assessment and Plan  Abdominal pain Pain has improved, no longer taking protonix Instructed that he may use this as needed if having increased symptoms He may also have an IBS component contributing to his pain given worsening cramping sensation when under stress He does also have a HIDA scan ordered by general surgery to evaluate the gallbladder.

## 2018-01-23 ENCOUNTER — Telehealth: Payer: Self-pay | Admitting: Internal Medicine

## 2018-01-23 NOTE — Telephone Encounter (Signed)
Patient is calling th wrong office.  He is trying to make a follow up with [facility].  He is provided their phone number

## 2018-02-01 ENCOUNTER — Encounter (HOSPITAL_COMMUNITY)
Admission: RE | Admit: 2018-02-01 | Discharge: 2018-02-01 | Disposition: A | Discharge status: Home / Self Care | Payer: BLUE CROSS/BLUE SHIELD | Source: Ambulatory Visit | Attending: Surgery | Admitting: Surgery

## 2018-02-01 DIAGNOSIS — R1011 Right upper quadrant pain: Secondary | ICD-10-CM | POA: Diagnosis not present

## 2018-02-01 MED ORDER — TECHNETIUM TC 99M MEBROFENIN IV KIT
5.3000 | PACK | Freq: Once | INTRAVENOUS | Status: AC
  Administered 2018-02-01: 5.3 via INTRAVENOUS

## 2018-02-03 ENCOUNTER — Ambulatory Visit: Payer: Self-pay | Admitting: Surgery

## 2018-02-03 NOTE — H&P (Signed)
History of Present Illness  The patient is a 22 year old male who presents with an abdominal muscle strain. Referred by Dr. Stan Head for possible periumbilical hernia  This is a very active healthy 22 yo male with an apparent umbilical hernia repair as an infant who presents with several years of a small palpable mass just above his umbilicus. Over the last year, this mass has enlarged slightly and has become more tender. It remains reducible. CT scan shows scarring from previous repair, but no significant defect.  Over the last several weeks, he has had some different upper abdominal symptoms. He has had a few episodes of pain and "burning" across the upper abdomen, worse on the right side. Sometimes this occurs after eating. Once this occurred after working out. No nausea. Some constipation. The patient cannot relate this to any certain types of food.   CLINICAL DATA: 22 year old male with right upper quadrant abdominal pain.  EXAM: CT ABDOMEN AND PELVIS WITH CONTRAST  TECHNIQUE: Multidetector CT imaging of the abdomen and pelvis was performed using the standard protocol following bolus administration of intravenous contrast.  CONTRAST: ISOVUE-300 IOPAMIDOL (ISOVUE-300) INJECTION 61%  COMPARISON: Abdominal CT dated 12/08/2016  FINDINGS: Lower chest: The visualized lung bases are clear.  No intra-abdominal free air or free fluid.  Hepatobiliary: No focal liver abnormality is seen. No gallstones, gallbladder wall thickening, or biliary dilatation.  Pancreas: Unremarkable. No pancreatic ductal dilatation or surrounding inflammatory changes.  Spleen: Normal in size without focal abnormality.  Adrenals/Urinary Tract: Adrenal glands are unremarkable. Kidneys are normal, without renal calculi, focal lesion, or hydronephrosis. Bladder is unremarkable.  Stomach/Bowel: There is moderate stool throughout the colon. There is no bowel obstruction or active  inflammation. Normal appendix.  Vascular/Lymphatic: No significant vascular findings are present. No enlarged abdominal or pelvic lymph nodes.  Reproductive: The prostate and seminal vesicles are grossly unremarkable.  Other: Probable prior umbilical hernia repair.  Musculoskeletal: No acute or significant osseous findings.  IMPRESSION: No acute intra-abdominopelvic pathology. No bowel obstruction or active inflammation. Normal appendix.   Electronically Signed By: Elgie Collard M.D. On: 10/18/2017 04:39   Past Surgical History No pertinent past surgical history   Diagnostic Studies History  Colonoscopy  never  Allergies  No Known Allergies [12/02/2017]: Allergies Reconciled   Medication History Pantoprazole Sodium (40MG  Tablet DR, Oral) Active. Ibuprofen (400MG  Tablet, Oral) Active. Albuterol (90MCG/ACT Aerosol Soln, Inhalation) Active. Metoprolol Succinate ER (Oral) Specific strength unknown - Active. Losartan Potassium (25MG  Tablet, Oral) Active. Medications Reconciled  Social History Alcohol use  Occasional alcohol use. No caffeine use  Tobacco use  Former smoker.  Family History Alcohol Abuse  Father. Hypertension  Brother.  Other Problems Chest pain  Gastric Ulcer     Review of Systems General Present- Chills. Not Present- Appetite Loss, Fatigue, Fever, Night Sweats, Weight Gain and Weight Loss. HEENT Not Present- Earache, Hearing Loss, Hoarseness, Nose Bleed, Oral Ulcers, Ringing in the Ears, Seasonal Allergies, Sinus Pain, Sore Throat, Visual Disturbances, Wears glasses/contact lenses and Yellow Eyes. Respiratory Not Present- Bloody sputum, Chronic Cough, Difficulty Breathing, Snoring and Wheezing. Breast Not Present- Breast Mass, Breast Pain, Nipple Discharge and Skin Changes. Cardiovascular Not Present- Chest Pain, Difficulty Breathing Lying Down, Leg Cramps, Palpitations, Rapid Heart Rate, Shortness of Breath and Swelling of  Extremities. Gastrointestinal Not Present- Abdominal Pain, Bloating, Bloody Stool, Change in Bowel Habits, Chronic diarrhea, Constipation, Difficulty Swallowing, Excessive gas, Gets full quickly at meals, Hemorrhoids, Indigestion, Nausea, Rectal Pain and Vomiting. Male Genitourinary Not Present-  Blood in Urine, Change in Urinary Stream, Frequency, Impotence, Nocturia, Painful Urination, Urgency and Urine Leakage. Musculoskeletal Present- Muscle Pain. Not Present- Back Pain, Joint Pain, Joint Stiffness, Muscle Weakness and Swelling of Extremities. Neurological Not Present- Decreased Memory, Fainting, Headaches, Numbness, Seizures, Tingling, Tremor, Trouble walking and Weakness. Psychiatric Not Present- Anxiety, Bipolar, Change in Sleep Pattern, Depression, Fearful and Frequent crying. Endocrine Not Present- Cold Intolerance, Excessive Hunger, Hair Changes, Heat Intolerance, Hot flashes and New Diabetes. Hematology Not Present- Blood Thinners, Easy Bruising, Excessive bleeding, Gland problems, HIV and Persistent Infections.  Vitals  Weight: 158.13 lb Height: 67in Body Surface Area: 1.83 m Body Mass Index: 24.77 kg/m  Temp.: 98.33F(Oral)  Pulse: 73 (Regular)  BP: 140/88 (Sitting, Left Arm, Standard)       Physical Exam  The physical exam findings are as follows: Note:WDWN in NAD Eyes: Pupils equal, round; sclera anicteric HENT: Oral mucosa moist; good dentition Neck: No masses palpated, no thyromegaly Lungs: CTA bilaterally; normal respiratory effort CV: Regular rate and rhythm; no murmurs; extremities well-perfused with no edema Abd: +bowel sounds, soft, minimal epigastric/ RUQ tenderness; healed infraumbilical scar Slight upper midline rectus diastasis. Small palpable fascial defect just above umbilicus with small palpable hernia on Valsalva maneuver Skin: Warm, dry; no sign of jaundice Psychiatric - alert and oriented x 4; calm mood and affect    Assessment &  Plan UMBILICAL HERNIA WITHOUT OBSTRUCTION OR GANGRENE (K42.9) Current Plans Umbilical hernia repair with mesh.  The surgical procedure has been discussed with the patient.  Potential risks, benefits, alternative treatments, and expected outcomes have been explained.  All of the patient's questions at this time have been answered.  The likelihood of reaching the patient's treatment goal is good.  The patient understand the proposed surgical procedure and wishes to proceed.  COLICKY RUQ ABDOMINAL PAIN (R10.11)  Note:Upon further close review of the CT scan images, it appears that he has some firm suture material or calcified scar at his previous umbilical incision. Just above the umbilicus, the midline fascia may have 1-2 very small defects with some protruding preperitoneal fat. The patient has very litle subcutaneous body fat, so this very small bulge is palpable and is probably causing his tenderness.  HIDA scan was normal with EF of 96%.  Wilmon Arms. Corliss Skains, MD, [facility]  General/ Trauma Surgery  02/03/2018 1:23 PM

## 2018-02-03 NOTE — Progress Notes (Signed)
Please call the patient and let them know that their HIDA scan showed normal GB function.  We can proceed with umbilical hernia repair.  I will fill out orders today.

## 2018-02-14 ENCOUNTER — Ambulatory Visit: Payer: Self-pay

## 2018-02-14 NOTE — Telephone Encounter (Signed)
Pt. Reports he has has been seeing blood in his stool x 1 month. Reports it is bright red and mixed in with the stool. Has seen a Production assistant, radio for my gallbladder and it was fine. He said I have a hernia in the middle of my stomach." Pt. States he was out of his Protonix over the weekend, but has started back on it.Request an appointment for Friday - scheduled. Instructed  if he sees more blood or has more pain to call back. Verbalizes understanding.  Reason for Disposition . MILD rectal bleeding (more than just a few drops or streaks)  Answer Assessment - Initial Assessment Questions 1. APPEARANCE of BLOOD: "What color is it?" "Is it passed separately, on the surface of the stool, or mixed in with the stool?"      Bright red - mixed in with stools 2. AMOUNT: "How much blood was passed?"      More than before 3. FREQUENCY: "How many times has blood been passed with the stools?"      More frequently 4. ONSET: "When was the blood first seen in the stools?" (Days or weeks)      1 month ago 5. DIARRHEA: "Is there also some diarrhea?" If so, ask: "How many diarrhea stools were passed in past 24 hours?"      No 6. CONSTIPATION: "Do you have constipation?" If so, "How bad is it?"     No 7. RECURRENT SYMPTOMS: "Have you had blood in your stools before?" If so, ask: "When was the last time?" and "What happened that time?"      No 8. BLOOD THINNERS: "Do you take any blood thinners?" (e.g., Coumadin/warfarin, Pradaxa/dabigatran, aspirin)     No 9. OTHER SYMPTOMS: "Do you have any other symptoms?"  (e.g., abdominal pain, vomiting, dizziness, fever)     Abdominal pain - cramping. Upper stomach 10. PREGNANCY: "Is there any chance you are pregnant?" "When was your last menstrual period?"       n/a  Protocols used: RECTAL BLEEDING-A-AH

## 2018-02-17 ENCOUNTER — Encounter: Payer: Self-pay | Admitting: Family Medicine

## 2018-02-17 ENCOUNTER — Ambulatory Visit (INDEPENDENT_AMBULATORY_CARE_PROVIDER_SITE_OTHER): Payer: BLUE CROSS/BLUE SHIELD | Admitting: Family Medicine

## 2018-02-17 VITALS — BP 106/82 | HR 62 | Temp 98.5°F | Ht 67.0 in | Wt 147.8 lb

## 2018-02-17 DIAGNOSIS — R1084 Generalized abdominal pain: Secondary | ICD-10-CM | POA: Diagnosis not present

## 2018-02-17 DIAGNOSIS — K625 Hemorrhage of anus and rectum: Secondary | ICD-10-CM

## 2018-02-17 DIAGNOSIS — I428 Other cardiomyopathies: Secondary | ICD-10-CM | POA: Insufficient documentation

## 2018-02-17 LAB — HEMOCCULT GUIAC POC 1CARD (OFFICE): Fecal Occult Blood, POC: NEGATIVE

## 2018-02-17 NOTE — Patient Instructions (Addendum)
Continue protonix I have put in an order for you to follow up with Dr. Leone Payor.

## 2018-02-17 NOTE — Assessment & Plan Note (Signed)
Hemoccult negative today Continue PPI Concern for possible IBD Will refer back to GI

## 2018-02-17 NOTE — Progress Notes (Signed)
Shawn Garcia - 22 y.o. male MRN [id number]  Date of birth: 1996-06-09  Subjective Chief Complaint  Patient presents with  . Rectal Bleeding    1 mo bloody stools, pt has stomach ulcer, pain yesterday in stomach but not now    HPI Shawn Garcia is a 22 y.o. male with history of cardiomyopathy here today with complaint of blood in his stool.  He reports that he has noticed blood mixed with his stool for the past month.  Has had ongoing abdominal pain off and on for several months.  Evaluated by GI previously and noted to have small hernia, he has seen Gen Surg for this and is planning on having this repaired.  I had seen him previously and he described pain as epigastric pain.  Was using ibuprofen quite a bit at that time which he has cut back on and started on protonix.  He had improvement of pain with protonix but ran out a few days ago and had recurrence of pain until restarting again.  Now tells me that pain has been located in several areas of the abdomen.   Reports that abdominal pain is improved today.  Denies nausea, vomiting, fever, chills, joint pain or rash.    ROS:  A comprehensive ROS was completed and negative except as noted per HPI.   No Known Allergies  Past Medical History:  Diagnosis Date  . Cardiomyopathy (HCC)   . Umbilical hernia     Past Surgical History:  Procedure Laterality Date  . UMBILICAL HERNIA REPAIR     childhood he thinks and CT shows changes    Social History   Socioeconomic History  . Marital status: Single    Spouse name: Not on file  . Number of children: Not on file  . Years of education: Not on file  . Highest education level: Not on file  Occupational History    Employer: [employer]  Social Needs  . Financial resource strain: Not on file  . Food insecurity:    Worry: Not on file    Inability: Not on file  . Transportation needs:    Medical: Not on file    Non-medical: Not on file  Tobacco Use  . Smoking status: Never Smoker    . Smokeless tobacco: Never Used  Substance and Sexual Activity  . Alcohol use: Yes  . Drug use: No  . Sexual activity: Never  Lifestyle  . Physical activity:    Days per week: Not on file    Minutes per session: Not on file  . Stress: Not on file  Relationships  . Social connections:    Talks on phone: Not on file    Gets together: Not on file    Attends religious service: Not on file    Active member of club or organization: Not on file    Attends meetings of clubs or organizations: Not on file    Relationship status: Not on file  Other Topics Concern  . 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 Diabetes Maternal Grandmother   . Diabetes Maternal Grandfather     Health Maintenance  Topic Date Due  . HIV Screening  12/02/2009  . TETANUS/TDAP  12/02/2013  . INFLUENZA VACCINE  02/16/2018    ----------------------------------------------------------------------------------------------------------------------------------------------------------------------------------------------------------------- Physical Exam BP 106/82   Pulse 62   Temp 98.5 F (36.9 C) (Oral)   Ht 5\' 7"  (1.702 m)   Wt 147 lb 12.8 oz (67 kg)   SpO2 96%   BMI 23.15 kg/m   Physical Exam  Constitutional: He is oriented to person, place, and time. He appears well-nourished. No distress.  HENT:  Head: Normocephalic and atraumatic.  Mouth/Throat: Oropharynx is clear and moist.  Eyes: No scleral icterus.  Neck: Neck supple. No thyromegaly present.  Cardiovascular: Normal rate, regular rhythm and normal heart sounds.  Pulmonary/Chest: Effort normal and breath sounds normal.  Abdominal: Soft. Bowel sounds are normal. He exhibits no distension. There is tenderness (mild ttp diffusely). There is no guarding.  Genitourinary: Rectum normal.   Genitourinary Comments: No gross blood or hemorrhoids noted.   Musculoskeletal: Normal range of motion.  Lymphadenopathy:    He has no cervical adenopathy.  Neurological: He is alert and oriented to person, place, and time.  Skin: Skin is warm and dry. No rash noted.  Psychiatric: He has a normal mood and affect. His behavior is normal.    ------------------------------------------------------------------------------------------------------------------------------------------------------------------------------------------------------------------- Assessment and Plan  Abdominal pain Hemoccult negative today Continue PPI Concern for possible IBD Will refer back to GI

## 2018-03-02 ENCOUNTER — Ambulatory Visit: Payer: Self-pay | Admitting: Surgery

## 2018-03-02 DIAGNOSIS — K429 Umbilical hernia without obstruction or gangrene: Secondary | ICD-10-CM | POA: Diagnosis not present

## 2018-03-02 NOTE — H&P (Signed)
History of Present Illness Shawn Garcia. Jarica Plass MD; 03/02/2018 9:34 AM) The patient is a 22 year old male who presents with an umbilical hernia. The patient is a 22 year old male who presents with an abdominal muscle strain. Referred by Dr. Stan Head for possible periumbilical hernia  This is a very active healthy 22 yo male with an apparent umbilical hernia repair as an infant who presents with several years of a small palpable mass just above his umbilicus. Over the last year, this mass has enlarged slightly and has become more tender. It remains reducible. CT scan shows scarring from previous repair, but no significant defect.  Over the last several weeks, he has had some different upper abdominal symptoms. He has had a few episodes of pain and "burning" across the upper abdomen, worse on the right side. Sometimes this occurs after eating. Once this occurred after working out. No nausea. Some constipation. The patient cannot relate this to any certain types of food. We obtained a HIDA scan that showed a normal GB EF of 96%. He has been diagnosed with peptic ulcer disease and is on Protonix. This seems to be slowly improving.  CLINICAL DATA: 22 year old male with right upper quadrant abdominal pain.  EXAM: CT ABDOMEN AND PELVIS WITH CONTRAST  TECHNIQUE: Multidetector CT imaging of the abdomen and pelvis was performed using the standard protocol following bolus administration of intravenous contrast.  CONTRAST: ISOVUE-300 IOPAMIDOL (ISOVUE-300) INJECTION 61%  COMPARISON: Abdominal CT dated 12/08/2016  FINDINGS: Lower chest: The visualized lung bases are clear.  No intra-abdominal free air or free fluid.  Hepatobiliary: No focal liver abnormality is seen. No gallstones, gallbladder wall thickening, or biliary dilatation.  Pancreas: Unremarkable. No pancreatic ductal dilatation or surrounding inflammatory changes.  Spleen: Normal in size without focal  abnormality.  Adrenals/Urinary Tract: Adrenal glands are unremarkable. Kidneys are normal, without renal calculi, focal lesion, or hydronephrosis. Bladder is unremarkable.  Stomach/Bowel: There is moderate stool throughout the colon. There is no bowel obstruction or active inflammation. Normal appendix.  Vascular/Lymphatic: No significant vascular findings are present. No enlarged abdominal or pelvic lymph nodes.  Reproductive: The prostate and seminal vesicles are grossly unremarkable.  Other: Probable prior umbilical hernia repair.  Musculoskeletal: No acute or significant osseous findings.  IMPRESSION: No acute intra-abdominopelvic pathology. No bowel obstruction or active inflammation. Normal appendix.   Electronically Signed By: Elgie Collard M.D. On: 10/18/2017 04:39  CLINICAL DATA: Right upper quadrant pain  EXAM: NUCLEAR MEDICINE HEPATOBILIARY IMAGING WITH GALLBLADDER EF  VIEWS: Anterior right upper quadrant  RADIOPHARMACEUTICALS: 5.3 mCi Tc-9m Choletec IV  COMPARISON: None.  FINDINGS: Liver uptake of radiotracer is normal. There is prompt visualization of gallbladder and small bowel, indicating patency of the cystic and common bile ducts. The patient consumed 8 ounces of Ensure orally with calculation of the computer generated ejection fraction of radiotracer from the gallbladder. The patient did not experience clinical symptoms with the oral Ensure consumption. The computer generated ejection fraction of radiotracer from the gallbladder is normal at 96%, normal greater than 33% using the oral agent.  IMPRESSION: Study within normal limits.   Electronically Signed By: Bretta Bang III M.D. On: 02/01/2018 12:33   Problem List/Past Medical Molli Hazard K. Jamarie Joplin, MD; 03/02/2018 9:34 AM) COLICKY RUQ ABDOMINAL PAIN (R10.11) UMBILICAL HERNIA WITHOUT OBSTRUCTION OR GANGRENE (K42.9)  Past Surgical History (Verleen Stuckey K. Marckus Hanover, MD; 03/02/2018  9:34 AM) No pertinent past surgical history  Diagnostic Studies History (Oberon Hehir K. Aerial Dilley, MD; 03/02/2018 9:34 AM) Colonoscopy never  Allergies (  Maurilio Lovely; 03/02/2018 8:40 AM) No Known Allergies [12/02/2017]: Allergies Reconciled  Medication History Maurilio Lovely; 03/02/2018 8:40 AM) Pantoprazole Sodium (40MG  Tablet DR, Oral) Active. Ibuprofen (400MG  Tablet, Oral) Active. Albuterol (90MCG/ACT Aerosol Soln, Inhalation) Active. Metoprolol Succinate ER (Oral) Specific strength unknown - Active. Losartan Potassium (25MG  Tablet, Oral) Active. Medications Reconciled  Social History Shawn Garcia. Maryuri Warnke, MD; 03/02/2018 9:34 AM) Alcohol use Occasional alcohol use. No caffeine use Tobacco use Former smoker.  Family History Shawn Garcia. Mercury Rock, MD; 03/02/2018 9:34 AM) Alcohol Abuse Father. Hypertension Brother.  Other Problems Shawn Garcia. Ellora Varnum, MD; 03/02/2018 9:34 AM) Chest pain Gastric Ulcer     Review of Systems Molli Hazard K. Denise Washburn MD; 03/02/2018 9:35 AM)  Note: General Present- Chills. Not Present- Appetite Loss, Fatigue, Fever, Night Sweats, Weight Gain and Weight Loss. HEENT Not Present- Earache, Hearing Loss, Hoarseness, Nose Bleed, Oral Ulcers, Ringing in the Ears, Seasonal Allergies, Sinus Pain, Sore Throat, Visual Disturbances, Wears glasses/contact lenses and Yellow Eyes. Respiratory Not Present- Bloody sputum, Chronic Cough, Difficulty Breathing, Snoring and Wheezing. Breast Not Present- Breast Mass, Breast Pain, Nipple Discharge and Skin Changes. Cardiovascular Not Present- Chest Pain, Difficulty Breathing Lying Down, Leg Cramps, Palpitations, Rapid Heart Rate, Shortness of Breath and Swelling of Extremities. Gastrointestinal Not Present- Abdominal Pain, Bloating, Bloody Stool, Change in Bowel Habits, Chronic diarrhea, Constipation, Difficulty Swallowing, Excessive gas, Gets full quickly at meals, Hemorrhoids, Indigestion, Nausea, Rectal Pain and Vomiting. Male  Genitourinary Not Present- Blood in Urine, Change in Urinary Stream, Frequency, Impotence, Nocturia, Painful Urination, Urgency and Urine Leakage. Musculoskeletal Present- Muscle Pain. Not Present- Back Pain, Joint Pain, Joint Stiffness, Muscle Weakness and Swelling of Extremities. Neurological Not Present- Decreased Memory, Fainting, Headaches, Numbness, Seizures, Tingling, Tremor, Trouble walking and Weakness. Psychiatric Not Present- Anxiety, Bipolar, Change in Sleep Pattern, Depression, Fearful and Frequent crying. Endocrine Not Present- Cold Intolerance, Excessive Hunger, Hair Changes, Heat Intolerance, Hot flashes and New Diabetes. Hematology Not Present- Blood Thinners, Easy Bruising, Excessive bleeding, Gland problems, HIV and Persistent Infections.   Vitals Maurilio Lovely; 03/02/2018 8:41 AM) 03/02/2018 8:40 AM Weight: 149.38 lb Height: 67in Body Surface Area: 1.79 m Body Mass Index: 23.4 kg/m  Temp.: 98.63F(Oral)  Pulse: 80 (Regular)  BP: 110/70 (Sitting, Left Arm, Standard)      Physical Exam Molli Hazard K. Wylan Gentzler MD; 03/02/2018 9:35 AM)  The physical exam findings are as follows: Note:WDWN in NAD Eyes: Pupils equal, round; sclera anicteric HENT: Oral mucosa moist; good dentition Neck: No masses palpated, no thyromegaly Lungs: CTA bilaterally; normal respiratory effort CV: Regular rate and rhythm; no murmurs; extremities well-perfused with no edema Abd: +bowel sounds, soft, minimal epigastric/ RUQ tenderness; healed infraumbilical scar Slight upper midline rectus diastasis. Small palpable fascial defect just above umbilicus with small palpable hernia on Valsalva maneuver Skin: Warm, dry; no sign of jaundice Psychiatric - alert and oriented x 4; calm mood and affect    Assessment & Plan Molli Hazard K. Montez Cuda MD; 03/02/2018 9:13 AM)  UMBILICAL HERNIA WITHOUT OBSTRUCTION OR GANGRENE (K42.9)  Current Plans Schedule for Surgery - Umbilical hernia repair with mesh.  The surgical procedure has been discussed with the patient. Potential risks, benefits, alternative treatments, and expected outcomes have been explained. All of the patient's questions at this time have been answered. The likelihood of reaching the patient's treatment goal is good. The patient understand the proposed surgical procedure and wishes to proceed.  Shawn Garcia. Corliss Skains, MD, [facility]  General/ Trauma Surgery  03/02/2018 9:35 AM

## 2018-04-18 ENCOUNTER — Ambulatory Visit: Payer: BLUE CROSS/BLUE SHIELD | Admitting: Internal Medicine

## 2018-06-01 ENCOUNTER — Other Ambulatory Visit: Payer: Self-pay | Admitting: Family Medicine

## 2018-06-24 DIAGNOSIS — M791 Myalgia, unspecified site: Secondary | ICD-10-CM | POA: Diagnosis not present

## 2018-06-24 DIAGNOSIS — M9901 Segmental and somatic dysfunction of cervical region: Secondary | ICD-10-CM | POA: Diagnosis not present

## 2018-06-24 DIAGNOSIS — M9905 Segmental and somatic dysfunction of pelvic region: Secondary | ICD-10-CM | POA: Diagnosis not present

## 2018-06-24 DIAGNOSIS — M9903 Segmental and somatic dysfunction of lumbar region: Secondary | ICD-10-CM | POA: Diagnosis not present

## 2018-06-24 DIAGNOSIS — M9902 Segmental and somatic dysfunction of thoracic region: Secondary | ICD-10-CM | POA: Diagnosis not present

## 2018-07-04 ENCOUNTER — Ambulatory Visit: Payer: Self-pay | Admitting: Surgery

## 2018-07-31 ENCOUNTER — Other Ambulatory Visit: Payer: Self-pay

## 2018-07-31 ENCOUNTER — Encounter (HOSPITAL_BASED_OUTPATIENT_CLINIC_OR_DEPARTMENT_OTHER): Payer: Self-pay | Admitting: *Deleted

## 2018-08-01 NOTE — Progress Notes (Signed)
Pt's pmh and notes from cardiology reviewed with Dr Renold Don. He is requesting cardiac clearance. Toniann Fail RN  at [facility] notified that pt will need this  additional clearance.

## 2018-08-02 DIAGNOSIS — K439 Ventral hernia without obstruction or gangrene: Secondary | ICD-10-CM | POA: Diagnosis not present

## 2018-08-02 DIAGNOSIS — I428 Other cardiomyopathies: Secondary | ICD-10-CM | POA: Diagnosis not present

## 2018-08-02 DIAGNOSIS — R0602 Shortness of breath: Secondary | ICD-10-CM | POA: Diagnosis not present

## 2018-08-02 DIAGNOSIS — R072 Precordial pain: Secondary | ICD-10-CM | POA: Diagnosis not present

## 2018-08-02 DIAGNOSIS — R0609 Other forms of dyspnea: Secondary | ICD-10-CM | POA: Diagnosis not present

## 2018-08-07 NOTE — Progress Notes (Signed)
Cardiac clearance and new echo results in Care Everywhere, also printed for the paper chart.

## 2018-08-08 ENCOUNTER — Ambulatory Visit (HOSPITAL_BASED_OUTPATIENT_CLINIC_OR_DEPARTMENT_OTHER)
Admission: RE | Admit: 2018-08-08 | Discharge: 2018-08-08 | Disposition: A | Discharge status: Home / Self Care | Payer: BLUE CROSS/BLUE SHIELD | Attending: Surgery | Admitting: Surgery

## 2018-08-08 ENCOUNTER — Ambulatory Visit (HOSPITAL_BASED_OUTPATIENT_CLINIC_OR_DEPARTMENT_OTHER): Payer: BLUE CROSS/BLUE SHIELD | Admitting: Anesthesiology

## 2018-08-08 ENCOUNTER — Encounter (HOSPITAL_BASED_OUTPATIENT_CLINIC_OR_DEPARTMENT_OTHER)
Admission: RE | Disposition: A | Discharge status: Home / Self Care | Payer: Self-pay | Source: Home / Self Care | Attending: Surgery

## 2018-08-08 ENCOUNTER — Encounter (HOSPITAL_BASED_OUTPATIENT_CLINIC_OR_DEPARTMENT_OTHER): Payer: Self-pay | Admitting: *Deleted

## 2018-08-08 ENCOUNTER — Other Ambulatory Visit: Payer: Self-pay

## 2018-08-08 DIAGNOSIS — K429 Umbilical hernia without obstruction or gangrene: Secondary | ICD-10-CM | POA: Diagnosis not present

## 2018-08-08 DIAGNOSIS — Z8249 Family history of ischemic heart disease and other diseases of the circulatory system: Secondary | ICD-10-CM | POA: Diagnosis not present

## 2018-08-08 DIAGNOSIS — K5900 Constipation, unspecified: Secondary | ICD-10-CM | POA: Diagnosis not present

## 2018-08-08 DIAGNOSIS — K279 Peptic ulcer, site unspecified, unspecified as acute or chronic, without hemorrhage or perforation: Secondary | ICD-10-CM | POA: Insufficient documentation

## 2018-08-08 DIAGNOSIS — Z79899 Other long term (current) drug therapy: Secondary | ICD-10-CM | POA: Insufficient documentation

## 2018-08-08 DIAGNOSIS — Z87891 Personal history of nicotine dependence: Secondary | ICD-10-CM | POA: Diagnosis not present

## 2018-08-08 HISTORY — PX: UMBILICAL HERNIA REPAIR: SHX196

## 2018-08-08 HISTORY — PX: INSERTION OF MESH: SHX5868

## 2018-08-08 HISTORY — DX: Other cardiomyopathies: I42.8

## 2018-08-08 HISTORY — DX: Dyspnea, unspecified: R06.00

## 2018-08-08 SURGERY — REPAIR, HERNIA, UMBILICAL, ADULT
Anesthesia: General | Site: Abdomen

## 2018-08-08 MED ORDER — LIDOCAINE HCL (PF) 1 % IJ SOLN
INTRAMUSCULAR | Status: AC
  Filled 2018-08-08: qty 30

## 2018-08-08 MED ORDER — SCOPOLAMINE 1 MG/3DAYS TD PT72: 1.0000 | MEDICATED_PATCH | Freq: Once | TRANSDERMAL | Status: DC | PRN

## 2018-08-08 MED ORDER — PROPOFOL 10 MG/ML IV BOLUS
INTRAVENOUS | Status: DC | PRN
  Administered 2018-08-08: 200 mg via INTRAVENOUS

## 2018-08-08 MED ORDER — OXYCODONE HCL 5 MG/5ML PO SOLN: 5.0000 mg | Freq: Once | ORAL | Status: AC | PRN

## 2018-08-08 MED ORDER — ONDANSETRON HCL 4 MG/2ML IJ SOLN
INTRAMUSCULAR | Status: AC
  Filled 2018-08-08: qty 2

## 2018-08-08 MED ORDER — FENTANYL CITRATE (PF) 100 MCG/2ML IJ SOLN
50.0000 ug | INTRAMUSCULAR | Status: AC | PRN
  Administered 2018-08-08 (×2): 25 ug via INTRAVENOUS
  Administered 2018-08-08: 100 ug via INTRAVENOUS

## 2018-08-08 MED ORDER — PROPOFOL 500 MG/50ML IV EMUL
INTRAVENOUS | Status: AC
  Filled 2018-08-08: qty 50

## 2018-08-08 MED ORDER — OXYCODONE HCL 5 MG PO TABS: 5.0000 mg | ORAL_TABLET | Freq: Four times a day (QID) | ORAL | 0 refills | Status: DC | PRN

## 2018-08-08 MED ORDER — LIDOCAINE 2% (20 MG/ML) 5 ML SYRINGE
INTRAMUSCULAR | Status: AC
  Filled 2018-08-08: qty 5

## 2018-08-08 MED ORDER — ONDANSETRON HCL 4 MG/2ML IJ SOLN: 4.0000 mg | Freq: Once | INTRAMUSCULAR | Status: DC | PRN

## 2018-08-08 MED ORDER — FENTANYL CITRATE (PF) 100 MCG/2ML IJ SOLN
INTRAMUSCULAR | Status: AC
  Filled 2018-08-08: qty 2

## 2018-08-08 MED ORDER — DEXAMETHASONE SODIUM PHOSPHATE 10 MG/ML IJ SOLN
INTRAMUSCULAR | Status: AC
  Filled 2018-08-08: qty 1

## 2018-08-08 MED ORDER — LIDOCAINE 2% (20 MG/ML) 5 ML SYRINGE
INTRAMUSCULAR | Status: DC | PRN
  Administered 2018-08-08: 100 mg via INTRAVENOUS

## 2018-08-08 MED ORDER — MIDAZOLAM HCL 2 MG/2ML IJ SOLN
INTRAMUSCULAR | Status: AC
  Filled 2018-08-08: qty 2

## 2018-08-08 MED ORDER — BUPIVACAINE-EPINEPHRINE 0.25% -1:200000 IJ SOLN
INTRAMUSCULAR | Status: DC | PRN
  Administered 2018-08-08: 10 mL

## 2018-08-08 MED ORDER — CEFAZOLIN SODIUM-DEXTROSE 2-4 GM/100ML-% IV SOLN
INTRAVENOUS | Status: AC
  Filled 2018-08-08: qty 100

## 2018-08-08 MED ORDER — CEFAZOLIN SODIUM-DEXTROSE 2-4 GM/100ML-% IV SOLN
2.0000 g | INTRAVENOUS | Status: AC
  Administered 2018-08-08: 2 g via INTRAVENOUS

## 2018-08-08 MED ORDER — ONDANSETRON HCL 4 MG/2ML IJ SOLN
INTRAMUSCULAR | Status: DC | PRN
  Administered 2018-08-08: 4 mg via INTRAVENOUS

## 2018-08-08 MED ORDER — FENTANYL CITRATE (PF) 100 MCG/2ML IJ SOLN
25.0000 ug | INTRAMUSCULAR | Status: DC | PRN
  Administered 2018-08-08 (×2): 50 ug via INTRAVENOUS

## 2018-08-08 MED ORDER — MIDAZOLAM HCL 2 MG/2ML IJ SOLN
1.0000 mg | INTRAMUSCULAR | Status: DC | PRN
  Administered 2018-08-08: 2 mg via INTRAVENOUS

## 2018-08-08 MED ORDER — DEXAMETHASONE SODIUM PHOSPHATE 4 MG/ML IJ SOLN
INTRAMUSCULAR | Status: DC | PRN
  Administered 2018-08-08: 10 mg via INTRAVENOUS

## 2018-08-08 MED ORDER — OXYCODONE HCL 5 MG PO TABS
ORAL_TABLET | ORAL | Status: AC
  Filled 2018-08-08: qty 1

## 2018-08-08 MED ORDER — LACTATED RINGERS IV SOLN
INTRAVENOUS | Status: DC
  Administered 2018-08-08 (×2): via INTRAVENOUS

## 2018-08-08 MED ORDER — CHLORHEXIDINE GLUCONATE CLOTH 2 % EX PADS: 6.0000 | MEDICATED_PAD | Freq: Once | CUTANEOUS | Status: DC

## 2018-08-08 MED ORDER — ACETAMINOPHEN 500 MG PO TABS
1000.0000 mg | ORAL_TABLET | ORAL | Status: AC
  Administered 2018-08-08: 1000 mg via ORAL

## 2018-08-08 MED ORDER — ACETAMINOPHEN 500 MG PO TABS
ORAL_TABLET | ORAL | Status: AC
  Filled 2018-08-08: qty 2

## 2018-08-08 MED ORDER — BUPIVACAINE-EPINEPHRINE (PF) 0.25% -1:200000 IJ SOLN
INTRAMUSCULAR | Status: AC
  Filled 2018-08-08: qty 60

## 2018-08-08 MED ORDER — GABAPENTIN 300 MG PO CAPS
300.0000 mg | ORAL_CAPSULE | ORAL | Status: AC
  Administered 2018-08-08: 300 mg via ORAL

## 2018-08-08 MED ORDER — OXYCODONE HCL 5 MG PO TABS
5.0000 mg | ORAL_TABLET | Freq: Once | ORAL | Status: AC | PRN
  Administered 2018-08-08: 5 mg via ORAL

## 2018-08-08 MED ORDER — GABAPENTIN 300 MG PO CAPS
ORAL_CAPSULE | ORAL | Status: AC
  Filled 2018-08-08: qty 1

## 2018-08-08 SURGICAL SUPPLY — 62 items
APL SKNCLS STERI-STRIP NONHPOA (GAUZE/BANDAGES/DRESSINGS) ×1
APL SWBSTK 6 STRL LF DISP (MISCELLANEOUS)
APPLICATOR COTTON TIP 6 STRL (MISCELLANEOUS) IMPLANT
APPLICATOR COTTON TIP 6IN STRL (MISCELLANEOUS)
BENZOIN TINCTURE PRP APPL 2/3 (GAUZE/BANDAGES/DRESSINGS) ×2 IMPLANT
BLADE CLIPPER SURG (BLADE) ×2 IMPLANT
BLADE HEX COATED 2.75 (ELECTRODE) ×2 IMPLANT
BLADE SURG 15 STRL LF DISP TIS (BLADE) ×1 IMPLANT
BLADE SURG 15 STRL SS (BLADE) ×2
CANISTER SUCT 1200ML W/VALVE (MISCELLANEOUS) IMPLANT
CHLORAPREP W/TINT 26ML (MISCELLANEOUS) ×2 IMPLANT
COVER BACK TABLE 60X90IN (DRAPES) ×2 IMPLANT
COVER MAYO STAND STRL (DRAPES) ×2 IMPLANT
COVER WAND RF STERILE (DRAPES) IMPLANT
DECANTER SPIKE VIAL GLASS SM (MISCELLANEOUS) IMPLANT
DRAPE LAPAROTOMY T 102X78X121 (DRAPES) ×2 IMPLANT
DRAPE UTILITY XL STRL (DRAPES) ×2 IMPLANT
DRSG TEGADERM 4X4.75 (GAUZE/BANDAGES/DRESSINGS) ×2 IMPLANT
ELECT REM PT RETURN 9FT ADLT (ELECTROSURGICAL) ×2
ELECTRODE REM PT RTRN 9FT ADLT (ELECTROSURGICAL) ×1 IMPLANT
GAUZE SPONGE 4X4 12PLY STRL LF (GAUZE/BANDAGES/DRESSINGS) IMPLANT
GLOVE BIO SURGEON STRL SZ7 (GLOVE) ×2 IMPLANT
GLOVE BIOGEL PI IND STRL 7.0 (GLOVE) ×2 IMPLANT
GLOVE BIOGEL PI IND STRL 7.5 (GLOVE) ×1 IMPLANT
GLOVE BIOGEL PI INDICATOR 7.0 (GLOVE) ×2
GLOVE BIOGEL PI INDICATOR 7.5 (GLOVE) ×1
GLOVE ECLIPSE 6.5 STRL STRAW (GLOVE) ×2 IMPLANT
GLOVE SURG SS PI 6.5 STRL IVOR (GLOVE) ×1 IMPLANT
GOWN STRL REUS W/ TWL LRG LVL3 (GOWN DISPOSABLE) ×2 IMPLANT
GOWN STRL REUS W/TWL LRG LVL3 (GOWN DISPOSABLE) ×6
MESH VENTRALEX ST 1-7/10 CRC S (Mesh General) ×1 IMPLANT
NDL HYPO 25X1 1.5 SAFETY (NEEDLE) ×1 IMPLANT
NDL SAFETY ECLIPSE 18X1.5 (NEEDLE) IMPLANT
NEEDLE HYPO 18GX1.5 SHARP (NEEDLE)
NEEDLE HYPO 25X1 1.5 SAFETY (NEEDLE) ×2 IMPLANT
NS IRRIG 1000ML POUR BTL (IV SOLUTION) IMPLANT
PACK BASIN DAY SURGERY FS (CUSTOM PROCEDURE TRAY) ×2 IMPLANT
PENCIL BUTTON HOLSTER BLD 10FT (ELECTRODE) ×2 IMPLANT
SLEEVE SCD COMPRESS KNEE MED (MISCELLANEOUS) ×2 IMPLANT
SPONGE GAUZE 2X2 8PLY STRL LF (GAUZE/BANDAGES/DRESSINGS) ×2 IMPLANT
STAPLER VISISTAT 35W (STAPLE) IMPLANT
STRIP CLOSURE SKIN 1/2X4 (GAUZE/BANDAGES/DRESSINGS) ×2 IMPLANT
SUT MNCRL AB 4-0 PS2 18 (SUTURE) ×2 IMPLANT
SUT NOVA 0 T19/GS 22DT (SUTURE) IMPLANT
SUT NOVA NAB DX-16 0-1 5-0 T12 (SUTURE) IMPLANT
SUT NOVA NAB GS-21 1 T12 (SUTURE) IMPLANT
SUT PDS AB 0 CT 36 (SUTURE) IMPLANT
SUT PROLENE 0 CT 2 (SUTURE) IMPLANT
SUT SILK 3 0 TIES 17X18 (SUTURE)
SUT SILK 3-0 18XBRD TIE BLK (SUTURE) IMPLANT
SUT VIC AB 2-0 CT1 27 (SUTURE)
SUT VIC AB 2-0 CT1 TAPERPNT 27 (SUTURE) IMPLANT
SUT VIC AB 3-0 SH 27 (SUTURE) ×2
SUT VIC AB 3-0 SH 27X BRD (SUTURE) ×1 IMPLANT
SUT VIC AB 4-0 BRD 54 (SUTURE) IMPLANT
SUT VIC AB 4-0 SH 18 (SUTURE) IMPLANT
SUT VICRYL 4-0 PS2 18IN ABS (SUTURE) IMPLANT
SYR BULB 3OZ (MISCELLANEOUS) IMPLANT
SYR CONTROL 10ML LL (SYRINGE) ×2 IMPLANT
TOWEL GREEN STERILE FF (TOWEL DISPOSABLE) ×2 IMPLANT
TUBE CONNECTING 20X1/4 (TUBING) IMPLANT
YANKAUER SUCT BULB TIP NO VENT (SUCTIONS) IMPLANT

## 2018-08-08 NOTE — Addendum Note (Signed)
Addendum  created 08/08/18 0935 by Burna Cash, CRNA   Charge Capture section accepted

## 2018-08-08 NOTE — H&P (Signed)
History of Present Illness  The patient is a 23 year old male who presents with an umbilical hernia. The patient is a 23 year old male who presents with an abdominal muscle strain. Referred by Dr. Stan Head for possible periumbilical hernia  This is a very active healthy 23 yo male with an apparent umbilical hernia repair as an infant who presents with several years of a small palpable mass just above his umbilicus. Over the last year, this mass has enlarged slightly and has become more tender. It remains reducible. CT scan shows scarring from previous repair, but no significant defect.  Over the last several weeks, he has had some different upper abdominal symptoms. He has had a few episodes of pain and "burning" across the upper abdomen, worse on the right side. Sometimes this occurs after eating. Once this occurred after working out. No nausea. Some constipation. The patient cannot relate this to any certain types of food. We obtained a HIDA scan that showed a normal GB EF of 96%. He has been diagnosed with peptic ulcer disease and is on Protonix. This seems to be slowly improving.  CLINICAL DATA: 23 year old male with right upper quadrant abdominal pain.  EXAM: CT ABDOMEN AND PELVIS WITH CONTRAST  TECHNIQUE: Multidetector CT imaging of the abdomen and pelvis was performed using the standard protocol following bolus administration of intravenous contrast.  CONTRAST: ISOVUE-300 IOPAMIDOL (ISOVUE-300) INJECTION 61%  COMPARISON: Abdominal CT dated 12/08/2016  FINDINGS: Lower chest: The visualized lung bases are clear.  No intra-abdominal free air or free fluid.  Hepatobiliary: No focal liver abnormality is seen. No gallstones, gallbladder wall thickening, or biliary dilatation.  Pancreas: Unremarkable. No pancreatic ductal dilatation or surrounding inflammatory changes.  Spleen: Normal in size without focal abnormality.  Adrenals/Urinary Tract:  Adrenal glands are unremarkable. Kidneys are normal, without renal calculi, focal lesion, or hydronephrosis. Bladder is unremarkable.  Stomach/Bowel: There is moderate stool throughout the colon. There is no bowel obstruction or active inflammation. Normal appendix.  Vascular/Lymphatic: No significant vascular findings are present. No enlarged abdominal or pelvic lymph nodes.  Reproductive: The prostate and seminal vesicles are grossly unremarkable.  Other: Probable prior umbilical hernia repair.  Musculoskeletal: No acute or significant osseous findings.  IMPRESSION: No acute intra-abdominopelvic pathology. No bowel obstruction or active inflammation. Normal appendix.   Electronically Signed By: Elgie Collard M.D. On: 10/18/2017 04:39  CLINICAL DATA: Right upper quadrant pain  EXAM: NUCLEAR MEDICINE HEPATOBILIARY IMAGING WITH GALLBLADDER EF  VIEWS: Anterior right upper quadrant  RADIOPHARMACEUTICALS: 5.3 mCi Tc-55m Choletec IV  COMPARISON: None.  FINDINGS: Liver uptake of radiotracer is normal. There is prompt visualization of gallbladder and small bowel, indicating patency of the cystic and common bile ducts. The patient consumed 8 ounces of Ensure orally with calculation of the computer generated ejection fraction of radiotracer from the gallbladder. The patient did not experience clinical symptoms with the oral Ensure consumption. The computer generated ejection fraction of radiotracer from the gallbladder is normal at 96%, normal greater than 33% using the oral agent.  IMPRESSION: Study within normal limits.   Electronically Signed By: Bretta Bang III M.D. On: 02/01/2018 12:33   Problem List/Past Medical COLICKY RUQ ABDOMINAL PAIN (R10.11) UMBILICAL HERNIA WITHOUT OBSTRUCTION OR GANGRENE (K42.9)  Past Surgical History  No pertinent past surgical history  Diagnostic Studies History  Colonoscopy  never  Allergies  No Known Allergies [12/02/2017]: Allergies Reconciled  Medication History  Pantoprazole Sodium (40MG  Tablet DR, Oral) Active. Ibuprofen (400MG  Tablet, Oral) Active. Albuterol (90MCG/ACT  Aerosol Soln, Inhalation) Active. Metoprolol Succinate ER (Oral) Specific strength unknown - Active. Losartan Potassium (25MG  Tablet, Oral) Active. Medications Reconciled  Social History  Alcohol use Occasional alcohol use. No caffeine use Tobacco use Former smoker.  Family History  Alcohol Abuse Father. Hypertension Brother.  Other Problems  Chest pain Gastric Ulcer     Review of Systems   Note: General Present- Chills. Not Present- Appetite Loss, Fatigue, Fever, Night Sweats, Weight Gain and Weight Loss. HEENT Not Present- Earache, Hearing Loss, Hoarseness, Nose Bleed, Oral Ulcers, Ringing in the Ears, Seasonal Allergies, Sinus Pain, Sore Throat, Visual Disturbances, Wears glasses/contact lenses and Yellow Eyes. Respiratory Not Present- Bloody sputum, Chronic Cough, Difficulty Breathing, Snoring and Wheezing. Breast Not Present- Breast Mass, Breast Pain, Nipple Discharge and Skin Changes. Cardiovascular Not Present- Chest Pain, Difficulty Breathing Lying Down, Leg Cramps, Palpitations, Rapid Heart Rate, Shortness of Breath and Swelling of Extremities. Gastrointestinal Not Present- Abdominal Pain, Bloating, Bloody Stool, Change in Bowel Habits, Chronic diarrhea, Constipation, Difficulty Swallowing, Excessive gas, Gets full quickly at meals, Hemorrhoids, Indigestion, Nausea, Rectal Pain and Vomiting. Male Genitourinary Not Present- Blood in Urine, Change in Urinary Stream, Frequency, Impotence, Nocturia, Painful Urination, Urgency and Urine Leakage. Musculoskeletal Present- Muscle Pain. Not Present- Back Pain, Joint Pain, Joint Stiffness, Muscle Weakness and Swelling of Extremities. Neurological Not Present- Decreased Memory, Fainting, Headaches,  Numbness, Seizures, Tingling, Tremor, Trouble walking and Weakness. Psychiatric Not Present- Anxiety, Bipolar, Change in Sleep Pattern, Depression, Fearful and Frequent crying. Endocrine Not Present- Cold Intolerance, Excessive Hunger, Hair Changes, Heat Intolerance, Hot flashes and New Diabetes. Hematology Not Present- Blood Thinners, Easy Bruising, Excessive bleeding, Gland problems, HIV and Persistent Infections.   Vitals  Weight: 149.38 lb Height: 67in Body Surface Area: 1.79 m Body Mass Index: 23.4 kg/m  Temp.: 98.44F(Oral)  Pulse: 80 (Regular)  BP: 110/70 (Sitting, Left Arm, Standard)      Physical Exam   The physical exam findings are as follows: Note:WDWN in NAD Eyes: Pupils equal, round; sclera anicteric HENT: Oral mucosa moist; good dentition Neck: No masses palpated, no thyromegaly Lungs: CTA bilaterally; normal respiratory effort CV: Regular rate and rhythm; no murmurs; extremities well-perfused with no edema Abd: +bowel sounds, soft, minimal epigastric/ RUQ tenderness; healed infraumbilical scar Slight upper midline rectus diastasis. Small palpable fascial defect just above umbilicus with small palpable hernia on Valsalva maneuver Skin: Warm, dry; no sign of jaundice Psychiatric - alert and oriented x 4; calm mood and affect    Assessment & Plan   UMBILICAL HERNIA WITHOUT OBSTRUCTION OR GANGRENE (K42.9)  Current Plans Schedule for Surgery - Umbilical hernia repair with mesh. The surgical procedure has been discussed with the patient. Potential risks, benefits, alternative treatments, and expected outcomes have been explained. All of the patient's questions at this time have been answered. The likelihood of reaching the patient's treatment goal is good. The patient understand the proposed surgical procedure and wishes to proceed.   Wilmon Arms. Corliss Skains, MD, [facility]  General/ Trauma Surgery Beeper [phone]  08/08/2018 7:06 AM

## 2018-08-08 NOTE — Transfer of Care (Signed)
Immediate Anesthesia Transfer of Care Note  Patient: Shawn Garcia  Procedure(s) Performed: HERNIA REPAIR UMBILICAL ADULT (N/A Abdomen) INSERTION OF MESH (N/A Abdomen)  Patient Location: PACU  Anesthesia Type:General  Level of Consciousness: sedated  Airway & Oxygen Therapy: Patient Spontanous Breathing and Patient connected to face mask oxygen  Post-op Assessment: Report given to RN and Post -op Vital signs reviewed and stable  Post vital signs: Reviewed and stable  Last Vitals:  Vitals Value Taken Time  BP 100/48 08/08/2018  8:20 AM  Temp    Pulse 56 08/08/2018  8:23 AM  Resp 13 08/08/2018  8:23 AM  SpO2 100 % 08/08/2018  8:23 AM  Vitals shown include unvalidated device data.  Last Pain:  Vitals:   08/08/18 0635  TempSrc: Oral  PainSc: 0-No pain      Patients Stated Pain Goal: 0 (08/08/18 7048)  Complications: No apparent anesthesia complications

## 2018-08-08 NOTE — Anesthesia Procedure Notes (Signed)
Procedure Name: LMA Insertion Date/Time: 08/08/2018 7:36 AM Performed by: Burna Cash, CRNA Pre-anesthesia Checklist: Patient identified, Emergency Drugs available, Suction available and Patient being monitored Patient Re-evaluated:Patient Re-evaluated prior to induction Oxygen Delivery Method: Circle system utilized Preoxygenation: Pre-oxygenation with 100% oxygen Induction Type: IV induction Ventilation: Mask ventilation without difficulty LMA: LMA inserted LMA Size: 4.0 Number of attempts: 1 Airway Equipment and Method: Bite block Placement Confirmation: positive ETCO2 Tube secured with: Tape Dental Injury: Teeth and Oropharynx as per pre-operative assessment

## 2018-08-08 NOTE — Anesthesia Preprocedure Evaluation (Addendum)
Anesthesia Evaluation  Patient identified by MRN, date of birth, ID band Patient awake    Reviewed: Allergy & Precautions, NPO status , Patient's Chart, lab work & pertinent test results, reviewed documented beta blocker date and time   History of Anesthesia Complications Negative for: history of anesthetic complications  Airway Mallampati: II  TM Distance: >3 FB Neck ROM: Full    Dental no notable dental hx.    Pulmonary neg pulmonary ROS,    Pulmonary exam normal        Cardiovascular Exercise Tolerance: Good Pt. on home beta blockers Normal cardiovascular exam  Left ventricular noncompaction cardiomyopathy   Neuro/Psych negative neurological ROS  negative psych ROS   GI/Hepatic Neg liver ROS, GERD  ,  Endo/Other  negative endocrine ROS  Renal/GU negative Renal ROS  negative genitourinary   Musculoskeletal negative musculoskeletal ROS (+)   Abdominal   Peds  Hematology negative hematology ROS (+)   Anesthesia Other Findings 23 yo M for umbilical hernia repair - PMH: LV noncompaction cardiomyopathy - Seen by Cardiologist Dr. Deatra James 08/02/18: "Mr. Aden returns to clinic for cardiology evaluation prior to hernia surgery next week. His symptoms of dyspnea and atypical chest pain are chronic, longstanding issues. His echocardiogram shows no changes from prior: normal biventricular function (EF>55%), normal diastolic function, no significant valvular dysfunction. He is low risk for cardiac complications during hernia surgery."  Reproductive/Obstetrics                            Anesthesia Physical Anesthesia Plan  ASA: II  Anesthesia Plan: General   Post-op Pain Management:    Induction: Intravenous  PONV Risk Score and Plan: 2 and Ondansetron, Dexamethasone, Midazolam and Treatment may vary due to age or medical condition  Airway Management Planned: Oral ETT  Additional Equipment:  None  Intra-op Plan:   Post-operative Plan: Extubation in OR  Informed Consent: I have reviewed the patients History and Physical, chart, labs and discussed the procedure including the risks, benefits and alternatives for the proposed anesthesia with the patient or authorized representative who has indicated his/her understanding and acceptance.     Dental advisory given  Plan Discussed with:   Anesthesia Plan Comments:        Anesthesia Quick Evaluation

## 2018-08-08 NOTE — Anesthesia Postprocedure Evaluation (Signed)
Anesthesia Post Note  Patient: Shawn Garcia  Procedure(s) Performed: HERNIA REPAIR UMBILICAL ADULT (N/A Abdomen) INSERTION OF MESH (N/A Abdomen)     Patient location during evaluation: PACU Anesthesia Type: General Level of consciousness: awake and alert Pain management: pain level controlled Vital Signs Assessment: post-procedure vital signs reviewed and stable Respiratory status: spontaneous breathing, nonlabored ventilation and respiratory function stable Cardiovascular status: blood pressure returned to baseline and stable Postop Assessment: no apparent nausea or vomiting Anesthetic complications: no    Last Vitals:  Vitals:   08/08/18 0902 08/08/18 0915  BP:  119/84  Pulse: (!) 52 (!) 110  Resp: 12 14  Temp:    SpO2: 100% 100%    Last Pain:  Vitals:   08/08/18 0915  TempSrc:   PainSc: 2                  Lucretia Kern

## 2018-08-08 NOTE — Op Note (Signed)
Indications:  The patient presented with a history of a small supraumbilical hernia.  The patient was examined and we recommended umbilical hernia repair with mesh.  Pre-operative diagnosis:  Umbilical hernia  Post-operative diagnosis:  Same  Procedure:  Umbilical hernia repair with mesh  Procedure Details  The patient was seen again in the Holding Room. The risks, benefits, complications, treatment options, and expected outcomes were discussed with the patient. The possibilities of reaction to medication, pulmonary aspiration, perforation of viscus, bleeding, recurrent infection, the need for additional procedures, and development of a complication requiring transfusion or further operation were discussed with the patient and/or family. There was concurrence with the proposed plan, and informed consent was obtained. The site of surgery was properly noted/marked. The patient was taken to the Operating Room, identified as [place], and the procedure verified as umbilical hernia repair. A Time Out was held and the above information confirmed.  After an adequate level of general anesthesia was obtained, the patient's abdomen was prepped with Chloraprep and draped in sterile fashion.  We made a transverse incision above the umbilicus.  Dissection was carried down to the hernia sac with cautery.  We dissected bluntly around the hernia sac down to the edge of the fascial defect.  The defect was located about 1.5 cm above the umbilicus.  We reduced the hernia sac back into the pre-peritoneal space.  The fascial defect measured 1.5 cm.  We cleared the fascia in all directions.  A small Ventralex mesh was inserted into the pre-peritoneal space and was deployed.  The mesh was secured with four trans-fascial sutures of 0 Novofil.  The fascial defect was closed with multiple interrupted figure-of-eight 0 Novofil sutures.  The base of the umbilicus was tacked down with 3-0 Vicryl.  3-0 Vicryl was used to close  the subcutaneous tissues and 4-0 Monocryl was used to close the skin.  Steri-strips and clean dressing were applied.  The patient was extubated and brought to the recovery room in stable condition.  All sponge, instrument, and needle counts were correct prior to closure and at the conclusion of the case.   Estimated Blood Loss: Minimal          Complications: None; patient tolerated the procedure well.         Disposition: PACU - hemodynamically stable.         Condition: stable  Shawn Garcia. Corliss Skains, MD, [facility]  General/ Trauma Surgery Beeper [phone]  08/08/2018 8:15 AM

## 2018-08-08 NOTE — Discharge Instructions (Signed)
NO TYLENOL BEFORE 12:30 PM Today!    Post Anesthesia Home Care Instructions  Activity: Get plenty of rest for the remainder of the day. A responsible individual must stay with you for 24 hours following the procedure.  For the next 24 hours, DO NOT: -Drive a car -Advertising copywriter -Drink alcoholic beverages -Take any medication unless instructed by your physician -Make any legal decisions or sign important papers.  Meals: Start with liquid foods such as gelatin or soup. Progress to regular foods as tolerated. Avoid greasy, spicy, heavy foods. If nausea and/or vomiting occur, drink only clear liquids until the nausea and/or vomiting subsides. Call your physician if vomiting continues.  Special Instructions/Symptoms: Your throat may feel dry or sore from the anesthesia or the breathing tube placed in your throat during surgery. If this causes discomfort, gargle with warm salt water. The discomfort should disappear within 24 hours.  If you had a scopolamine patch placed behind your ear for the management of post- operative nausea and/or vomiting:  1. The medication in the patch is effective for 72 hours, after which it should be removed.  Wrap patch in a tissue and discard in the trash. Wash hands thoroughly with soap and water. 2. You may remove the patch earlier than 72 hours if you experience unpleasant side effects which may include dry mouth, dizziness or visual disturbances. 3. Avoid touching the patch. Wash your hands with soap and water after contact with the patch.       [facility] _______[facility]  UMBILICAL HERNIA REPAIR: POST OP INSTRUCTIONS  Always review your discharge instruction sheet given to you by the facility where your surgery was performed. IF YOU HAVE DISABILITY OR FAMILY LEAVE FORMS, YOU MUST BRING THEM TO THE OFFICE FOR PROCESSING.   DO NOT GIVE THEM TO YOUR DOCTOR.  1. A  prescription for pain medication may be given to you upon discharge.   Take your pain medication as prescribed, if needed.  If narcotic pain medicine is not needed, then you may take acetaminophen (Tylenol) or ibuprofen (Advil) as needed. 2. Take your usually prescribed medications unless otherwise directed. If you need a refill on your pain medication, please contact your pharmacy.  They will contact our office to request authorization. Prescriptions will not be filled after 5 pm or on week-ends. 3. You should follow a light diet the first 24 hours after arrival home, such as soup and crackers, etc.  Be sure to include lots of fluids daily.  Resume your normal diet the day after surgery. 4.Most patients will experience some swelling and bruising around the umbilicus.  Ice packs and reclining will help.  Swelling and bruising can take several days to resolve.  6. It is common to experience some constipation if taking pain medication after surgery.  Increasing fluid intake and taking a stool softener (such as Colace) will usually help or prevent this problem from occurring.  A mild laxative (Milk of Magnesia or Miralax) should be taken according to package directions if there are no bowel movements after 48 hours. 7. Unless discharge instructions indicate otherwise, you may remove your bandages 48 hours after surgery, and you may shower at that time.  You may have steri-strips (small skin tapes) in place directly over the incision.  These strips should be left on the skin for 7-10 days.  8. ACTIVITIES:  You may resume regular (light) daily activities beginning the next day--such as daily self-care, walking, climbing stairs--gradually increasing activities as tolerated.  You may have sexual intercourse when it is comfortable.  Refrain from any heavy lifting or straining until approved by your doctor.  a.You may drive when you are no longer taking prescription pain medication, you can comfortably wear a seatbelt, and you can safely maneuver your car and apply brakes. b.RETURN TO  WORK:   _____________________________________________  9.You should see your doctor in the office for a follow-up appointment approximately 2-3 weeks after your surgery.  Make sure that you call for this appointment within a day or two after you arrive home to insure a convenient appointment time. 10.OTHER INSTRUCTIONS: _________________________    _____________________________________  WHEN TO CALL YOUR DOCTOR: 1. Fever over 101.0 2. Inability to urinate 3. Nausea and/or vomiting 4. Extreme swelling or bruising 5. Continued bleeding from incision. 6. Increased pain, redness, or drainage from the incision  The clinic staff is available to answer your questions during regular business hours.  Please dont hesitate to call and ask to speak to one of the nurses for clinical concerns.  If you have a medical emergency, go to the nearest emergency room or call 911.  A surgeon from [facility] is always on call at the hospital   [address], [place], [place]  [address] ?  [address] [address], [place], [place]   [address] [phone] ? [phone] ? FAX [phone] Web site: [email or web address]

## 2018-08-09 ENCOUNTER — Encounter (HOSPITAL_BASED_OUTPATIENT_CLINIC_OR_DEPARTMENT_OTHER): Payer: Self-pay | Admitting: Surgery

## 2019-01-03 ENCOUNTER — Emergency Department (HOSPITAL_COMMUNITY)
Admission: EM | Admit: 2019-01-03 | Discharge: 2019-01-04 | Disposition: A | Discharge status: Home / Self Care | Payer: Self-pay | Attending: Emergency Medicine | Admitting: Emergency Medicine

## 2019-01-03 ENCOUNTER — Other Ambulatory Visit: Payer: Self-pay

## 2019-01-03 ENCOUNTER — Encounter (HOSPITAL_COMMUNITY): Payer: Self-pay

## 2019-01-03 DIAGNOSIS — R1031 Right lower quadrant pain: Secondary | ICD-10-CM | POA: Insufficient documentation

## 2019-01-03 LAB — CBC
HCT: 42.5 % (ref 39.0–52.0)
Hemoglobin: 13.5 g/dL (ref 13.0–17.0)
MCH: 23.8 pg — ABNORMAL LOW (ref 26.0–34.0)
MCHC: 31.8 g/dL (ref 30.0–36.0)
MCV: 74.8 fL — ABNORMAL LOW (ref 80.0–100.0)
Platelets: 232 10*3/uL (ref 150–400)
RBC: 5.68 MIL/uL (ref 4.22–5.81)
RDW: 13.3 % (ref 11.5–15.5)
WBC: 5 10*3/uL (ref 4.0–10.5)
nRBC: 0 % (ref 0.0–0.2)

## 2019-01-03 LAB — COMPREHENSIVE METABOLIC PANEL
ALT: 14 U/L (ref 0–44)
AST: 19 U/L (ref 15–41)
Albumin: 4.9 g/dL (ref 3.5–5.0)
Alkaline Phosphatase: 65 U/L (ref 38–126)
Anion gap: 9 (ref 5–15)
BUN: 12 mg/dL (ref 6–20)
CO2: 26 mmol/L (ref 22–32)
Calcium: 9.5 mg/dL (ref 8.9–10.3)
Chloride: 104 mmol/L (ref 98–111)
Creatinine, Ser: 0.91 mg/dL (ref 0.61–1.24)
GFR calc Af Amer: >60 mL/min (ref >60)
GFR calc non Af Amer: >60 mL/min (ref >60)
Glucose, Bld: 96 mg/dL (ref 70–99)
Potassium: 3.7 mmol/L (ref 3.5–5.1)
Sodium: 139 mmol/L (ref 135–145)
Total Bilirubin: 0.5 mg/dL (ref 0.3–1.2)
Total Protein: 8 g/dL (ref 6.5–8.1)

## 2019-01-03 LAB — URINALYSIS, ROUTINE W REFLEX MICROSCOPIC
Bilirubin Urine: NEGATIVE
Glucose, UA: NEGATIVE mg/dL
Hgb urine dipstick: NEGATIVE
Ketones, ur: 20 mg/dL — AB
Leukocytes,Ua: NEGATIVE
Nitrite: NEGATIVE
Protein, ur: NEGATIVE mg/dL
Specific Gravity, Urine: 1.009 (ref 1.005–1.030)
pH: 6 (ref 5.0–8.0)

## 2019-01-03 LAB — LIPASE, BLOOD: Lipase: 32 U/L (ref 11–51)

## 2019-01-03 MED ORDER — SODIUM CHLORIDE 0.9% FLUSH
3.0000 mL | Freq: Once | INTRAVENOUS | Status: AC
  Administered 2019-01-04: 3 mL via INTRAVENOUS

## 2019-01-03 NOTE — ED Triage Notes (Signed)
Pt reports RLQ abdominal pain that has developed over the last few days. States that it is worse with movement. Endorses nausea with no vomiting. Denies fever. A&Ox4. Ambulatory.

## 2019-01-04 ENCOUNTER — Emergency Department (HOSPITAL_COMMUNITY): Payer: Self-pay

## 2019-01-04 ENCOUNTER — Encounter (HOSPITAL_COMMUNITY): Payer: Self-pay

## 2019-01-04 MED ORDER — DICYCLOMINE HCL 20 MG PO TABS: 20.0000 mg | ORAL_TABLET | Freq: Two times a day (BID) | ORAL | 0 refills | Status: DC

## 2019-01-04 MED ORDER — MORPHINE SULFATE (PF) 4 MG/ML IV SOLN
4.0000 mg | Freq: Once | INTRAVENOUS | Status: AC
  Administered 2019-01-04: 4 mg via INTRAVENOUS
  Filled 2019-01-04: qty 1

## 2019-01-04 MED ORDER — FENTANYL CITRATE (PF) 100 MCG/2ML IJ SOLN
50.0000 ug | Freq: Once | INTRAMUSCULAR | Status: AC
  Administered 2019-01-04: 50 ug via INTRAVENOUS
  Filled 2019-01-04: qty 2

## 2019-01-04 MED ORDER — IOHEXOL 300 MG/ML  SOLN
100.0000 mL | Freq: Once | INTRAMUSCULAR | Status: AC | PRN
  Administered 2019-01-04: 100 mL via INTRAVENOUS

## 2019-01-04 MED ORDER — SODIUM CHLORIDE (PF) 0.9 % IJ SOLN
INTRAMUSCULAR | Status: AC
  Filled 2019-01-04: qty 50

## 2019-01-04 NOTE — Discharge Instructions (Signed)
CT scan was normal today. Recommend to follow-up with your primary care doctor. Return here for new concerns.

## 2019-01-04 NOTE — ED Provider Notes (Signed)
[facility]-EMERGENCY DEPT Provider Note   CSN: [id number] Arrival date & time: 01/03/19  2103     History   Chief Complaint Chief Complaint  Patient presents with  . Abdominal Pain    HPI Shawn Garcia is a 23 y.o. male.     The history is provided by medical records and the patient.  Abdominal Pain Associated symptoms: nausea      23 year old male with history of left ventricular cardiomyopathy, umbilical hernia status post repair in January 2020, presenting to the ED with right lower abdominal pain.  States since his surgery he has had some on-and-off pain in his right lower abdomen but attributed it to gas.  States of the past few days this is gotten a lot worse and has been more of a persistent pain.  Reports it feels like some sharp, stabbing pain with intermittent "draining" sensation.  He reports nausea but denies vomiting.  He has had very poor oral intake due to lack of appetite.  He has tried changing his diet to see if this makes any difference in his pain but has not noticed any improvement.  He has not had any fevers.  He has not had any difficulty urinating.  He has not have a bowel movement but thinks this may be because he is eating less.  He has not tried taking any medications for his symptoms.  States he looked up his symptoms online and was concerned for appendicitis.  Past Medical History:  Diagnosis Date  . Dyspnea    dyspnea on exertion  . Left ventricular noncompaction cardiomyopathy (HCC)   . Umbilical hernia     Patient Active Problem List   Diagnosis Date Noted  . Left ventricular noncompaction cardiomyopathy (HCC) 02/17/2018  . Abdominal pain 11/28/2017  . Anemia 11/28/2017  . Ventral hernia without obstruction or gangrene 09/16/2017  . Dyspnea on effort 08/28/2015  . Warts 01/07/2015  . Precordial chest pain 07/10/2014    Past Surgical History:  Procedure Laterality Date  . INSERTION OF MESH N/A 08/08/2018   Procedure: INSERTION OF MESH;  Surgeon: Manus Rudd, MD;  Location: [facility];  Service: General;  Laterality: N/A;  . UMBILICAL HERNIA REPAIR     childhood he thinks and CT shows changes  . UMBILICAL HERNIA REPAIR N/A 08/08/2018   Procedure: HERNIA REPAIR UMBILICAL ADULT;  Surgeon: Manus Rudd, MD;  Location:  SURGERY CENTER;  Service: General;  Laterality: N/A;        Home Medications    Prior to Admission medications   Medication Sig Start Date End Date Taking? Authorizing Provider  losartan (COZAAR) 25 MG tablet Take 25 mg by mouth daily.   Yes [provider]  metoprolol succinate (TOPROL-XL) 25 MG 24 hr tablet Take 25 mg by mouth daily. 12/05/18  Yes [provider]  oxyCODONE (OXY IR/ROXICODONE) 5 MG immediate release tablet Take 1 tablet (5 mg total) by mouth every 6 (six) hours as needed for severe pain. Patient not taking: Reported on 01/03/2019 08/08/18   Manus Rudd, MD  pantoprazole (PROTONIX) 40 MG tablet TAKE 1 TABLET BY MOUTH TWICE DAILY Patient not taking: Reported on 01/03/2019 06/01/18   Everrett Coombe, DO    Family History Family History  Problem Relation Age of Onset  . Diabetes Maternal Grandmother   . Diabetes Maternal Grandfather     Social History Social History   Tobacco Use  . Smoking status: Never Smoker  . Smokeless tobacco: Never  Used  Substance Use Topics  . Alcohol use: Yes    Comment: occas  . Drug use: No     Allergies   Patient has no known allergies.   Review of Systems Review of Systems  Constitutional: Positive for appetite change.  Gastrointestinal: Positive for abdominal pain and nausea.  All other systems reviewed and are negative.    Physical Exam Updated Vital Signs BP 117/84 (BP Location: Right Arm)   Pulse 71   Temp 98.8 F (37.1 C) (Oral)   Resp 16   Ht 5\' 7"  (1.702 m)   Wt 70.3 kg   SpO2 100%   BMI 24.28 kg/m   Physical Exam Vitals signs and nursing note  reviewed.  Constitutional:      Appearance: He is well-developed.  HENT:     Head: Normocephalic and atraumatic.  Eyes:     Conjunctiva/sclera: Conjunctivae normal.     Pupils: Pupils are equal, round, and reactive to light.  Neck:     Musculoskeletal: Normal range of motion.  Cardiovascular:     Rate and Rhythm: Normal rate and regular rhythm.     Heart sounds: Normal heart sounds.  Pulmonary:     Effort: Pulmonary effort is normal.     Breath sounds: Normal breath sounds.  Abdominal:     General: Bowel sounds are normal.     Palpations: Abdomen is soft.     Tenderness: There is abdominal tenderness in the right lower quadrant. There is no rebound.     Comments: Soft, tenderness in the right lower quadrant, no peritoneal signs, normal bowel sounds  Musculoskeletal: Normal range of motion.  Skin:    General: Skin is warm and dry.  Neurological:     Mental Status: He is alert and oriented to person, place, and time.      ED Treatments / Results  Labs (all labs ordered are listed, but only abnormal results are displayed) Labs Reviewed  CBC - Abnormal; Notable for the following components:      Result Value   MCV 74.8 (*)    MCH 23.8 (*)    All other components within normal limits  URINALYSIS, ROUTINE W REFLEX MICROSCOPIC - Abnormal; Notable for the following components:   Color, Urine STRAW (*)    Ketones, ur 20 (*)    All other components within normal limits  LIPASE, BLOOD  COMPREHENSIVE METABOLIC PANEL    EKG    Radiology Ct Abdomen Pelvis W Contrast  Result Date: 01/04/2019 CLINICAL DATA:  Abd pain, appendicitis suspected RLQ pain for 3 days, nausea, no appetite EXAM: CT ABDOMEN AND PELVIS WITH CONTRAST TECHNIQUE: Multidetector CT imaging of the abdomen and pelvis was performed using the standard protocol following boluTrinitas HosPremiThe OrthopUniversity Of MarBMercer CountFloUrlogy AmbulSolThe MedicSteaNovant HealthLos Palos AmbulIdCommon [facility] LcWeBerLowella DandyOMarland KitcheBurman Nievesn HaleterahBerLowella DandyOMarland Kitc<MEASUREMENAuburn [phone] re clear. Hepatobiliary: No focal liver abnormality is seen. No gallstones, gallbladder wall thickening, or biliary dilatation. No ductal dilatation or inflammation. Pancreas: Unremarkable. No pancreatic ductal dilatation or surrounding inflammatory changes. Spleen: Normal in size without focal abnormality. Adrenals/Urinary Tract: Adrenal glands are unremarkable. Kidneys are normal, without renal calculi, focal lesion, or hydronephrosis. Bladder is unremarkable. Stomach/Bowel: Bowel evaluation limited in the absence of enteric contrast and paucity of intra-abdominal fat. The stomach is nondistended. Normal positioning of the ligament of Treitz. No small bowel wall thickening, inflammation, or obstruction. Normal appendix, for example image 49 series 5. Normal terminal ileum. Moderate stool in the ascending,  transverse, and descending colon. No abnormal rectal distention. Vascular/Lymphatic: No significant vascular findings are present. No enlarged abdominal or pelvic lymph nodes. Reproductive: Prostate is unremarkable. Other: Prior umbilical hernia repair without recurrent hernia. No free air, free fluid, or intra-abdominal fluid collection. Musculoskeletal: There are no acute or suspicious osseous abnormalities. IMPRESSION: No acute abnormality or explanation for abdominal pain. Particularly, normal appendix. Electronically Signed   By: Keith Rake M.D.   On: 01/04/2019 01:17    Procedures Procedures (including critical care time)  Medications Ordered in ED Medications  sodium chloride (PF) 0.9 % injection (has no administration in time range)  sodium chloride flush (NS) 0.9 % injection 3 mL (3 mLs Intravenous Given 01/04/19 0040)  fentaNYL (SUBLIMAZE) injection 50 mcg (50 mcg Intravenous Given 01/04/19 0040)  morphine 4 MG/ML injection 4 mg (4 mg Intravenous Given 01/04/19 0041)  iohexol (OMNIPAQUE) 300 MG/ML solution 100 mL (100 mLs Intravenous Contrast Given 01/04/19 0059)      Initial Impression / Assessment and Plan / ED Course  I have reviewed the triage vital signs and the nursing notes.  Pertinent labs & imaging results that were available during my care of the patient were reviewed by me and considered in my medical decision making (see chart for details).  23 year old male here with right lower quadrant abdominal pain.  Has had some intermittent issues with this since his umbilical hernia repair in January, but reports worse over the past few days and more constant.  He is afebrile and nontoxic.  Does have tenderness in the right lower abdomen without peritoneal signs.  Screening labs are overall reassuring.  CT scan was obtained which is negative, specifically normal appendix.  Patient appears stable for discharge home.  I recommended that he follow-up closely with his primary care doctor.  Return here for any new or acute changes.  Final Clinical Impressions(s) / ED Diagnoses   Final diagnoses:  RLQ abdominal pain    ED Discharge Orders         Ordered    dicyclomine (BENTYL) 20 MG tablet  2 times daily     01/04/19 0229           Larene Pickett, PA-C 01/04/19 0231    Molpus, Jenny Reichmann, MD 01/04/19 [id number]

## 2019-01-16 ENCOUNTER — Inpatient Hospital Stay: Payer: Self-pay | Admitting: Family Medicine

## 2019-03-02 DIAGNOSIS — M25312 Other instability, left shoulder: Secondary | ICD-10-CM | POA: Insufficient documentation

## 2019-04-04 IMAGING — CT CT ABD-PELV W/ CM
2 of 4 series · 16 of 46 positions shown, 18 images · IV contrast (agent unspecified)
Comparison: None.

CLINICAL DATA: Periumbilical pain. Nausea, vomiting, diarrhea,
fever, and chills.

EXAM:
CT ABDOMEN AND PELVIS WITH CONTRAST
TECHNIQUE: Multidetector CT imaging of the abdomen and pelvis was performed
using the standard protocol following bolus administration of
intravenous contrast.
CONTRAST:  100 mL [id number]

[Series 2: abd/pel with · axial · 0.65mm/px · z∈[+1155,+1560]mm · 13 of 91 slices shown, 15 images]
[im 5/91  soft-tissue]
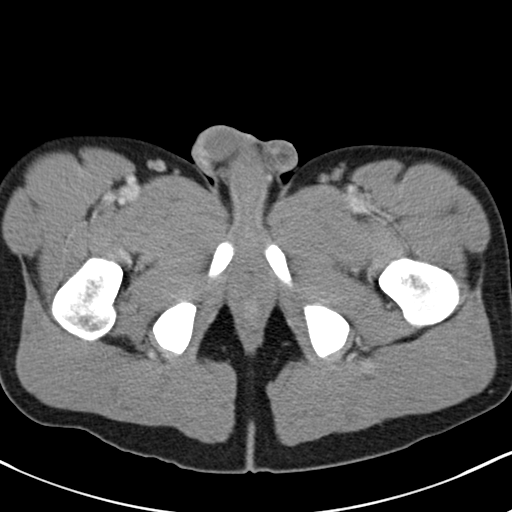
[im 5/91  bone]
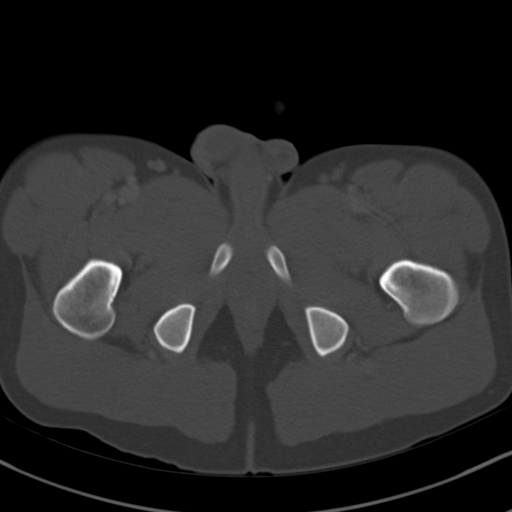
[im 15/91  soft-tissue]
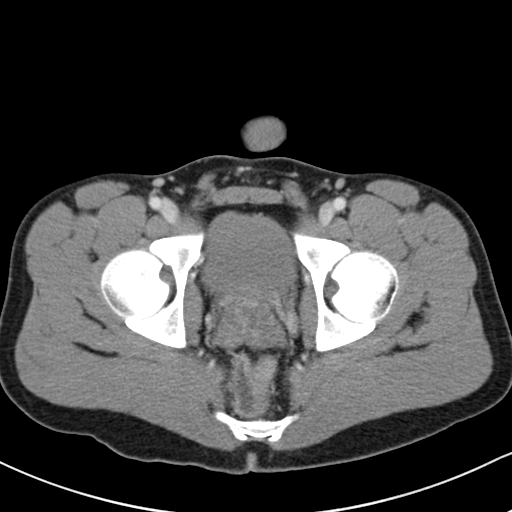
[im 19/91  soft-tissue]
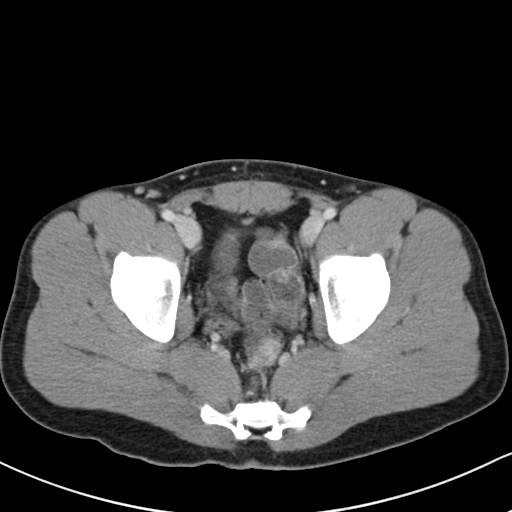
[im 24/91  soft-tissue]
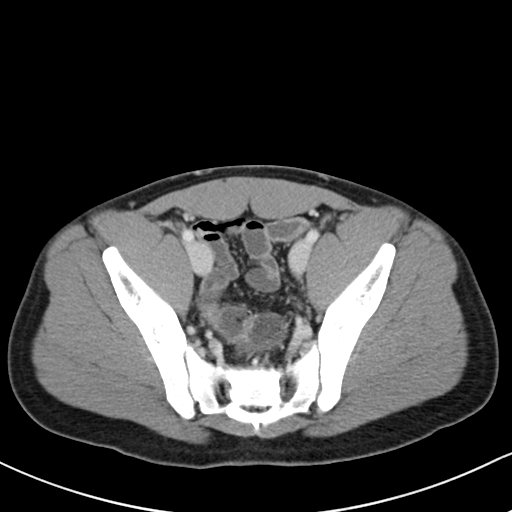
[im 34/91  soft-tissue]
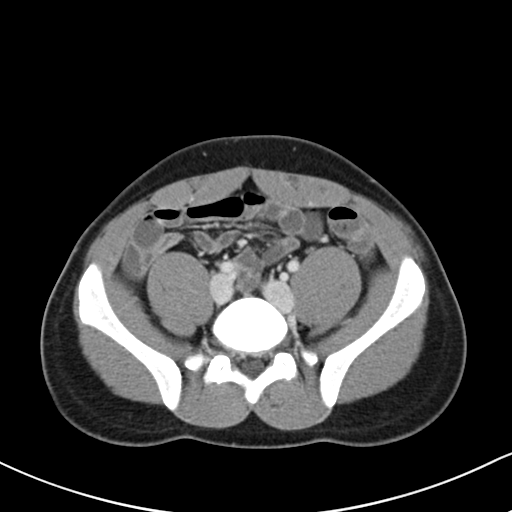
[im 38/91  soft-tissue]
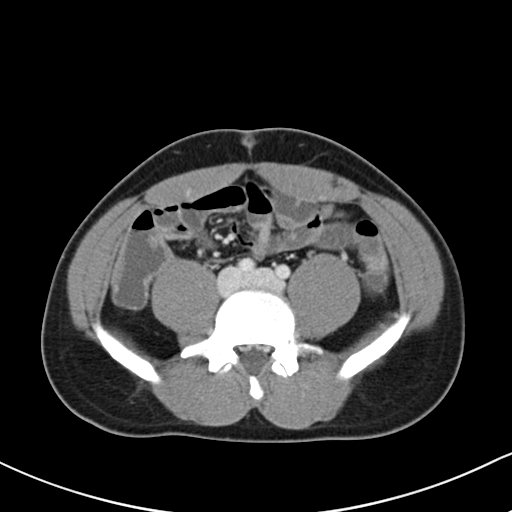
[im 48/91  soft-tissue]
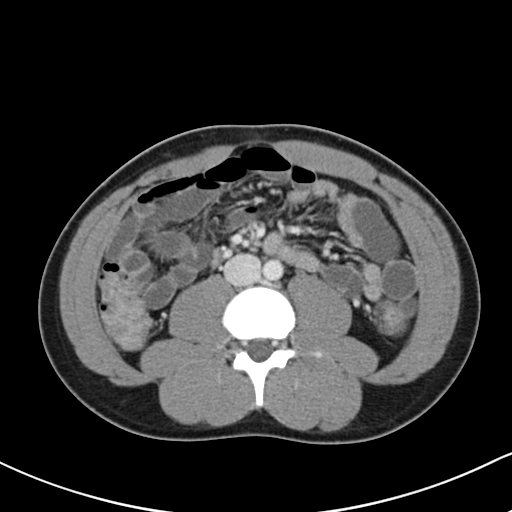
[im 53/91  soft-tissue]
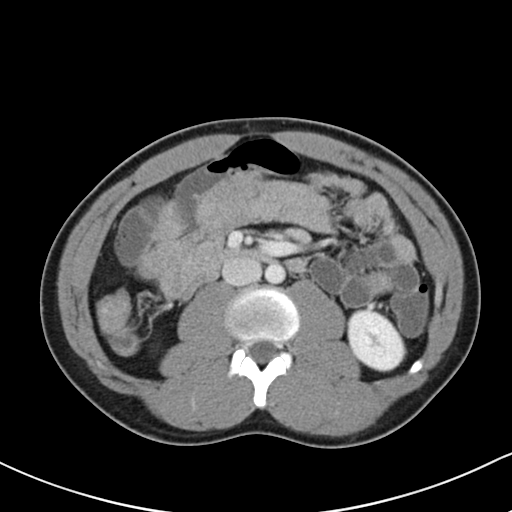
[im 57/91  soft-tissue]
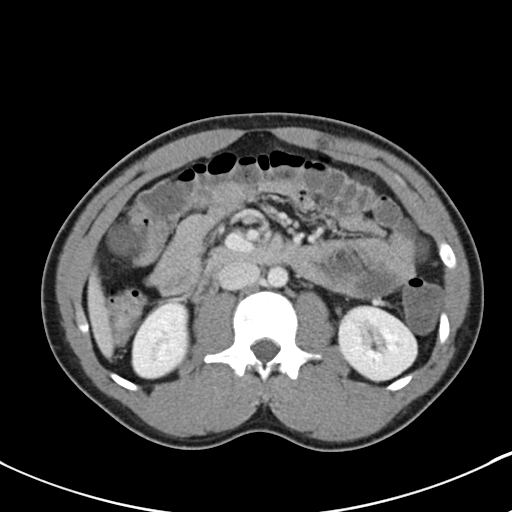
[im 57/91  bone]
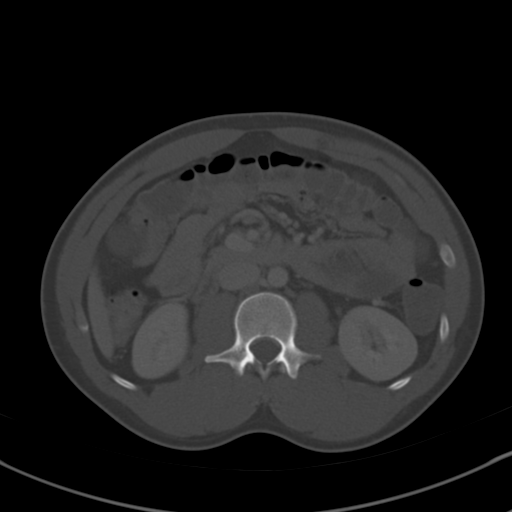
[im 67/91  soft-tissue]
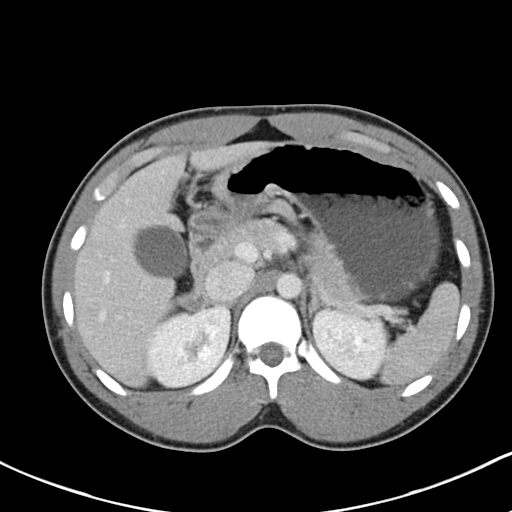
[im 72/91  soft-tissue]
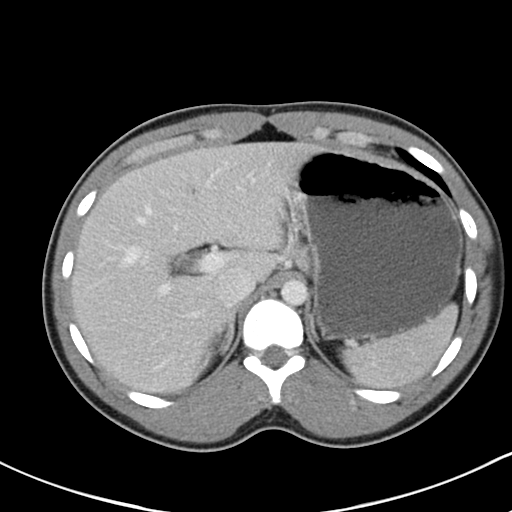
[im 76/91  soft-tissue]
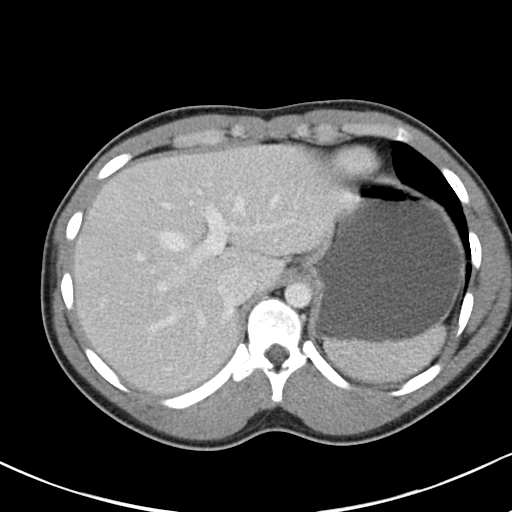
[im 86/91  soft-tissue]
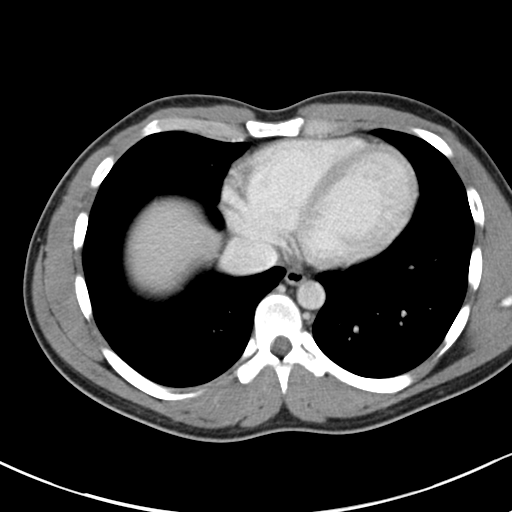

[Series 4: coronal a/|p · coronal · 0.61mm/px · 3 of 113 slices shown]
[im 38/113  soft-tissue]
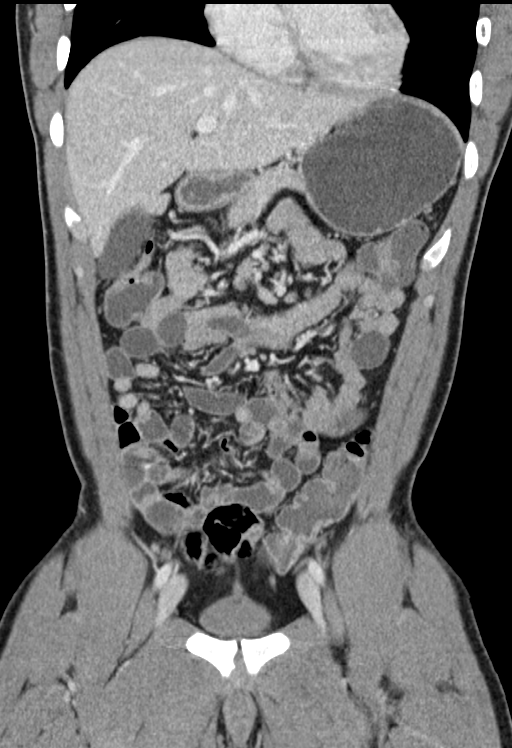
[im 50/113  soft-tissue]
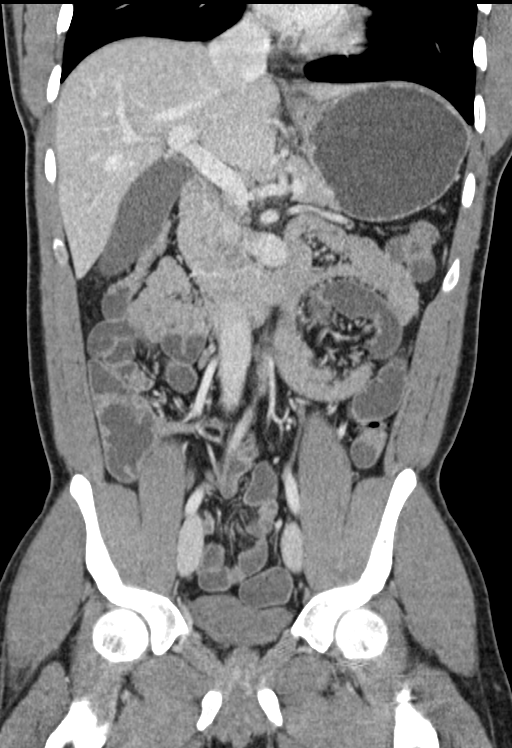
[im 63/113  soft-tissue]
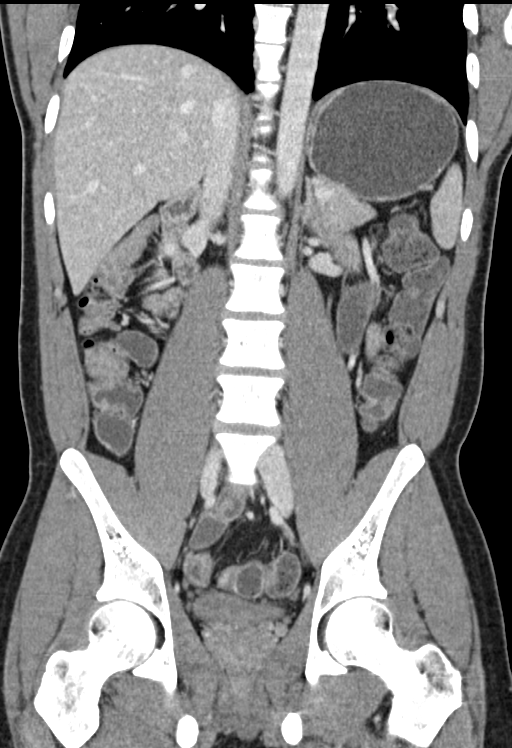

[16 of 46 positions shown; findings below may reference images not displayed]

FINDINGS: Lower chest: The lung bases are clear.

Hepatobiliary: No focal liver abnormality is seen. No gallstones,
gallbladder wall thickening, or biliary dilatation.

Pancreas: Unremarkable. No pancreatic ductal dilatation or
surrounding inflammatory changes.

Spleen: Normal in size without focal abnormality.

Adrenals/Urinary Tract: Adrenal glands are unremarkable. Kidneys are
normal, without renal calculi, focal lesion, or hydronephrosis.
Bladder is unremarkable.

Stomach/Bowel: Fluid-filled colon consistent with history of
diarrhea. No colonic distention or wall thickening. No pericolonic
inflammatory changes. No small bowel distention or wall thickening.
Stomach is unremarkable.

Vascular/Lymphatic: No significant vascular findings are present. No
enlarged abdominal or pelvic lymph nodes.

Reproductive: Prostate is unremarkable.

Other: No abdominal wall hernia or abnormality. No abdominopelvic
ascites.

Musculoskeletal: No acute or significant osseous findings.
IMPRESSION: No acute process demonstrated in the abdomen or pelvis. Fluid-filled
colon is consistent with history of diarrhea. No evidence of bowel
obstruction or inflammatory change.

## 2019-10-11 ENCOUNTER — Encounter: Payer: Self-pay | Admitting: Family Medicine

## 2019-10-26 ENCOUNTER — Encounter: Payer: Self-pay | Admitting: Family Medicine

## 2019-11-12 ENCOUNTER — Ambulatory Visit (INDEPENDENT_AMBULATORY_CARE_PROVIDER_SITE_OTHER): Payer: BLUE CROSS/BLUE SHIELD | Admitting: Family Medicine

## 2019-11-12 ENCOUNTER — Encounter: Payer: Self-pay | Admitting: Family Medicine

## 2019-11-12 ENCOUNTER — Other Ambulatory Visit: Payer: Self-pay

## 2019-11-12 VITALS — BP 114/72 | HR 81 | Temp 97.4°F | Ht 67.0 in | Wt 138.6 lb

## 2019-11-12 DIAGNOSIS — I428 Other cardiomyopathies: Secondary | ICD-10-CM

## 2019-11-12 DIAGNOSIS — F418 Other specified anxiety disorders: Secondary | ICD-10-CM | POA: Insufficient documentation

## 2019-11-12 DIAGNOSIS — R0602 Shortness of breath: Secondary | ICD-10-CM

## 2019-11-12 DIAGNOSIS — Z0000 Encounter for general adult medical examination without abnormal findings: Secondary | ICD-10-CM

## 2019-11-12 NOTE — Progress Notes (Signed)
Established Patient Office Visit  Subjective:  Patient ID: Shawn Garcia, male    DOB: 21-Apr-1996  Age: 24 y.o. MRN: [id number]  CC:  Chief Complaint  Patient presents with  . Transitions Of Care    TOC from dr. Zigmund Daniel, issues with breathing sometimes and weight concerns.     HPI Shawn Garcia presents for reestablishment of care and a complete physical exam with some current concerns.  Significant past medical history of left ventricular noncompaction cardiomyopathy.  This is managed at [facility].  He has at least yearly follow-up.  Patient is running a business.  He has lost multiple role models over the last year.  He is stressed and also sad.  He complains of shortness of breath at random times.  He has no history of asthma.  Denies wheezing or reactive airway disease associated with illness.  There is some dyspnea with exertion.  He does not smoke or use illicit drugs.  He rarely drinks alcohol.  Past Medical History:  Diagnosis Date  . Dyspnea    dyspnea on exertion  . Left ventricular noncompaction cardiomyopathy ([place])   . Umbilical hernia     Past Surgical History:  Procedure Laterality Date  . INSERTION OF MESH N/A 08/08/2018   Procedure: INSERTION OF MESH;  Surgeon: Donnie Mesa, MD;  Location: [place];  Service: General;  Laterality: N/A;  . [place]     childhood he thinks and CT shows changes  . UMBILICAL HERNIA REPAIR N/A 08/08/2018   Procedure: HERNIA REPAIR UMBILICAL ADULT;  Surgeon: Donnie Mesa, MD;  Location: [place];  Service: General;  Laterality: N/A;    Family History  Problem Relation Age of Onset  . Diabetes Maternal Grandmother   . Diabetes Maternal Grandfather     Social History   Socioeconomic History  . Marital status: Single    Spouse name: Not on file  . Number of children: Not on file  . Years of education: Not on file  . Highest education level: Not on file    Occupational History    Employer: [employer]  Tobacco Use  . Smoking status: Never Smoker  . Smokeless tobacco: Never Used  Substance and Sexual Activity  . Alcohol use: Yes    Comment: occas  . Drug use: No  . Sexual activity: Never  Other Topics Concern  . Not on file  Social History Narrative   Single, no kids   EtOH 2x/week   No tobacco or drug use   Working at [employer]   Also a Counsellor    Social Determinants of Radio broadcast assistant Strain:   . Difficulty of Paying Living Expenses:   Food Insecurity:   . Worried About Charity fundraiser in the Last Year:   . Arboriculturist in the Last Year:   Transportation Needs:   . Film/video editor (Medical):   Marland Kitchen Lack of Transportation (Non-Medical):   Physical Activity:   . Days of Exercise per Week:   . Minutes of Exercise per Session:   Stress:   . Feeling of Stress :   Social Connections:   . Frequency of Communication with Friends and Family:   . Frequency of Social Gatherings with Friends and Family:   . Attends Religious Services:   . Active Member of Clubs or Organizations:   . Attends Archivist Meetings:   Marland Kitchen Marital Status:  Intimate Partner Violence:   . Fear of Current or Ex-Partner:   . Emotionally Abused:   Marland Kitchen Physically Abused:   . Sexually Abused:     Outpatient Medications Prior to Visit  Medication Sig Dispense Refill  . Ascorbic Acid (VITAMIN C) 1000 MG tablet Take by mouth.    . Cholecalciferol (VITAMIN D3 PO) Take by mouth.    . ELDERBERRY PO Take by mouth.    . famotidine (PEPCID) 40 MG tablet Take by mouth.    Marland Kitchen GLUCOSAMINE SULFATE PO Take by mouth.    . losartan (COZAAR) 25 MG tablet Take 25 mg by mouth daily.    . metoprolol succinate (TOPROL-XL) 25 MG 24 hr tablet Take 25 mg by mouth daily.    . Multiple Vitamin (MULTI-DAY VITAMINS) TABS Take by mouth.    . TURMERIC PO Take by mouth.    . Vitamin E 100 units TABS Take by mouth.    . dicyclomine  (BENTYL) 20 MG tablet Take 1 tablet (20 mg total) by mouth 2 (two) times daily. (Patient not taking: Reported on 11/12/2019) 20 tablet 0  . oxyCODONE (OXY IR/ROXICODONE) 5 MG immediate release tablet Take 1 tablet (5 mg total) by mouth every 6 (six) hours as needed for severe pain. (Patient not taking: Reported on 01/03/2019) 20 tablet 0  . pantoprazole (PROTONIX) 40 MG tablet TAKE 1 TABLET BY MOUTH TWICE DAILY (Patient not taking: Reported on 01/03/2019) 60 tablet 1   No facility-administered medications prior to visit.    No Known Allergies  ROS Review of Systems  Constitutional: Negative.   HENT: Negative.   Eyes: Negative for photophobia and visual disturbance.  Respiratory: Positive for shortness of breath. Negative for chest tightness and wheezing.   Cardiovascular: Negative.   Gastrointestinal: Negative.   Endocrine: Negative for polyphagia and polyuria.  Genitourinary: Negative.   Musculoskeletal: Negative.   Skin: Negative for pallor and rash.  Allergic/Immunologic: Negative for immunocompromised state.  Neurological: Negative for speech difficulty and weakness.  Hematological: Does not bruise/bleed easily.  Psychiatric/Behavioral: Positive for dysphoric mood. The patient is nervous/anxious.       Objective:    Physical Exam  Constitutional: He is oriented to person, place, and time. He appears well-developed and well-nourished. No distress.  HENT:  Head: Normocephalic and atraumatic.  Right Ear: External ear normal.  Left Ear: External ear normal.  Eyes: Conjunctivae are normal. Right eye exhibits no discharge. Left eye exhibits no discharge. No scleral icterus.  Neck: No JVD present. No tracheal deviation present. No thyromegaly present.  Cardiovascular: Normal rate, regular rhythm and normal heart sounds.  Pulmonary/Chest: Effort normal and breath sounds normal. No stridor.  Abdominal: Bowel sounds are normal. He exhibits no distension. There is no abdominal  tenderness. There is no rebound and no guarding. Hernia confirmed negative in the right inguinal area and confirmed negative in the left inguinal area.  Genitourinary: Right testis shows no mass, no swelling and no tenderness. Right testis is descended. Left testis shows no mass, no swelling and no tenderness. Left testis is descended. Circumcised. No hypospadias, penile erythema or penile tenderness. No discharge found.  Musculoskeletal:        General: No edema.  Lymphadenopathy:    He has no cervical adenopathy.       Right: No inguinal adenopathy present.       Left: No inguinal adenopathy present.  Neurological: He is alert and oriented to person, place, and time.  Skin: Skin is warm and dry.  He is not diaphoretic.  Psychiatric: He has a normal mood and affect. His behavior is normal.    BP 114/72   Pulse 81   Temp (!) 97.4 F (36.3 C) (Tympanic)   Ht 5\' 7"  (1.702 m)   Wt 138 lb 9.6 oz (62.9 kg)   SpO2 99%   BMI 21.71 kg/m  Wt Readings from Last 3 Encounters:  11/12/19 138 lb 9.6 oz (62.9 kg)  01/03/19 155 lb (70.3 kg)  08/08/18 154 lb 12.2 oz (70.2 kg)     Health Maintenance Due  Topic Date Due  . HIV Screening  Never done    There are no preventive care reminders to display for this patient.  Lab Results  Component Value Date   TSH 1.015 08/19/2015   Lab Results  Component Value Date   WBC 5.0 01/03/2019   HGB 13.5 01/03/2019   HCT 42.5 01/03/2019   MCV 74.8 (L) 01/03/2019   PLT 232 01/03/2019   Lab Results  Component Value Date   NA 139 01/03/2019   K 3.7 01/03/2019   CO2 26 01/03/2019   GLUCOSE 96 01/03/2019   BUN 12 01/03/2019   CREATININE 0.91 01/03/2019   BILITOT 0.5 01/03/2019   ALKPHOS 65 01/03/2019   AST 19 01/03/2019   ALT 14 01/03/2019   PROT 8.0 01/03/2019   ALBUMIN 4.9 01/03/2019   CALCIUM 9.5 01/03/2019   ANIONGAP 9 01/03/2019   No results found for: CHOL No results found for: HDL No results found for: LDLCALC No results found  for: TRIG No results found for: CHOLHDL No results found for: 01/05/2019    Assessment & Plan:   Problem List Items Addressed This Visit      Cardiovascular and Mediastinum   Left ventricular noncompaction cardiomyopathy (HCC) - Primary     Other   SOB (shortness of breath)   Relevant Orders   Ambulatory referral to Pulmonology   Anxiety with depression   Relevant Orders   Ambulatory referral to Psychology    Other Visit Diagnoses    Healthcare maintenance       Relevant Orders   CBC   Comprehensive metabolic panel   Lipid panel   HIV Antibody (routine testing w rflx)   TSH   Urinalysis, Routine w reflex microscopic      No orders of the defined types were placed in this encounter.   Follow-up: Return in about 3 months (around 02/11/2020), or if symptoms worsen or fail to improve.  Prefers to go for talking therapy regarding his anxiety and depression.  Will return fasting for above ordered blood work.  Pulmonary evaluation for his shortness of breath.  Was given information on health maintenance and stress reduction.  02/13/2020, MD

## 2019-11-12 NOTE — Patient Instructions (Signed)
Health Maintenance, Male Adopting a healthy lifestyle and getting preventive care are important in promoting health and wellness. Ask your health care provider about:  The right schedule for you to have regular tests and exams.  Things you can do on your own to prevent diseases and keep yourself healthy. What should I know about diet, weight, and exercise? Eat a healthy diet   Eat a diet that includes plenty of vegetables, fruits, low-fat dairy products, and lean protein.  Do not eat a lot of foods that are high in solid fats, added sugars, or sodium. Maintain a healthy weight Body mass index (BMI) is a measurement that can be used to identify possible weight problems. It estimates body fat based on height and weight. Your health care provider can help determine your BMI and help you achieve or maintain a healthy weight. Get regular exercise Get regular exercise. This is one of the most important things you can do for your health. Most adults should:  Exercise for at least 150 minutes each week. The exercise should increase your heart rate and make you sweat (moderate-intensity exercise).  Do strengthening exercises at least twice a week. This is in addition to the moderate-intensity exercise.  Spend less time sitting. Even light physical activity can be beneficial. Watch cholesterol and blood lipids Have your blood tested for lipids and cholesterol at 25 years of age, then have this test every 5 years. You may need to have your cholesterol levels checked more often if:  Your lipid or cholesterol levels are high.  You are older than 25 years of age.  You are at high risk for heart disease. What should I know about cancer screening? Many types of cancers can be detected early and may often be prevented. Depending on your health history and family history, you may need to have cancer screening at various ages. This may include screening for:  Colorectal cancer.  Prostate  cancer.  Skin cancer.  Lung cancer. What should I know about heart disease, diabetes, and high blood pressure? Blood pressure and heart disease  High blood pressure causes heart disease and increases the risk of stroke. This is more likely to develop in people who have high blood pressure readings, are of African descent, or are overweight.  Talk with your health care provider about your target blood pressure readings.  Have your blood pressure checked: ? Every 3-5 years if you are 18-39 years of age. ? Every year if you are 40 years old or older.  If you are between the ages of 65 and 75 and are a current or former smoker, ask your health care provider if you should have a one-time screening for abdominal aortic aneurysm (AAA). Diabetes Have regular diabetes screenings. This checks your fasting blood sugar level. Have the screening done:  Once every three years after age 45 if you are at a normal weight and have a low risk for diabetes.  More often and at a younger age if you are overweight or have a high risk for diabetes. What should I know about preventing infection? Hepatitis B If you have a higher risk for hepatitis B, you should be screened for this virus. Talk with your health care provider to find out if you are at risk for hepatitis B infection. Hepatitis C Blood testing is recommended for:  Everyone born from 1945 through 1965.  Anyone with known risk factors for hepatitis C. Sexually transmitted infections (STIs)  You should be screened each year   for STIs, including gonorrhea and chlamydia, if: ? You are sexually active and are younger than 25 years of age. ? You are older than 25 years of age and your health care provider tells you that you are at risk for this type of infection. ? Your sexual activity has changed since you were last screened, and you are at increased risk for chlamydia or gonorrhea. Ask your health care provider if you are at risk.  Ask your  health care provider about whether you are at high risk for HIV. Your health care provider may recommend a prescription medicine to help prevent HIV infection. If you choose to take medicine to prevent HIV, you should first get tested for HIV. You should then be tested every 3 months for as long as you are taking the medicine. Follow these instructions at home: Lifestyle  Do not use any products that contain nicotine or tobacco, such as cigarettes, e-cigarettes, and chewing tobacco. If you need help quitting, ask your health care provider.  Do not use street drugs.  Do not share needles.  Ask your health care provider for help if you need support or information about quitting drugs. Alcohol use  Do not drink alcohol if your health care provider tells you not to drink.  If you drink alcohol: ? Limit how much you have to 0-2 drinks a day. ? Be aware of how much alcohol is in your drink. In the [place] one drink equals one 12 oz bottle of beer (355 mL), one 5 oz glass of wine (148 mL), or one 1 oz glass of hard liquor (44 mL). General instructions  Schedule regular health, dental, and eye exams.  Stay current with your vaccines.  Tell your health care provider if: ? You often feel depressed. ? You have ever been abused or do not feel safe at home. Summary  Adopting a healthy lifestyle and getting preventive care are important in promoting health and wellness.  Follow your health care provider's instructions about healthy diet, exercising, and getting tested or screened for diseases.  Follow your health care provider's instructions on monitoring your cholesterol and blood pressure. This information is not intended to replace advice given to you by your health care provider. Make sure you discuss any questions you have with your health care provider. Document Revised: 06/28/2018 Document Reviewed: 06/28/2018 Elsevier Patient Education  [place],  Adult After being diagnosed with an anxiety disorder, you may be relieved to know why you have felt or behaved a certain way. You may also feel overwhelmed about the treatment ahead and what it will mean for your life. With care and support, you can manage this condition and recover from it. How to manage lifestyle changes Managing stress and anxiety  Stress is your body's reaction to life changes and events, both good and bad. Most stress will last just a few hours, but stress can be ongoing and can lead to more than just stress. Although stress can play a major role in anxiety, it is not the same as anxiety. Stress is usually caused by something external, such as a deadline, test, or competition. Stress normally passes after the triggering event has ended.  Anxiety is caused by something internal, such as imagining a terrible outcome or worrying that something will go wrong that will devastate you. Anxiety often does not go away even after the triggering event is over, and it can become long-term (chronic) worry. It is important to understand  the differences between stress and anxiety and to manage your stress effectively so that it does not lead to an anxious response. Talk with your health care provider or a counselor to learn more about reducing anxiety and stress. He or she may suggest tension reduction techniques, such as:  Music therapy. This can include creating or listening to music that you enjoy and that inspires you.  Mindfulness-based meditation. This involves being aware of your normal breaths while not trying to control your breathing. It can be done while sitting or walking.  Centering prayer. This involves focusing on a word, phrase, or sacred image that means something to you and brings you peace.  Deep breathing. To do this, expand your stomach and inhale slowly through your nose. Hold your breath for 3-5 seconds. Then exhale slowly, letting your stomach muscles  relax.  Self-talk. This involves identifying thought patterns that lead to anxiety reactions and changing those patterns.  Muscle relaxation. This involves tensing muscles and then relaxing them. Choose a tension reduction technique that suits your lifestyle and personality. These techniques take time and practice. Set aside 5-15 minutes a day to do them. Therapists can offer counseling and training in these techniques. The training to help with anxiety may be covered by some insurance plans. Other things you can do to manage stress and anxiety include:  Keeping a stress/anxiety diary. This can help you learn what triggers your reaction and then learn ways to manage your response.  Thinking about how you react to certain situations. You may not be able to control everything, but you can control your response.  Making time for activities that help you relax and not feeling guilty about spending your time in this way.  Visual imagery and yoga can help you stay calm and relax.  Medicines Medicines can help ease symptoms. Medicines for anxiety include:  Anti-anxiety drugs.  Antidepressants. Medicines are often used as a primary treatment for anxiety disorder. Medicines will be prescribed by a health care provider. When used together, medicines, psychotherapy, and tension reduction techniques may be the most effective treatment. Relationships Relationships can play a big part in helping you recover. Try to spend more time connecting with trusted friends and family members. Consider going to couples counseling, taking family education classes, or going to family therapy. Therapy can help you and others better understand your condition. How to recognize changes in your anxiety Everyone responds differently to treatment for anxiety. Recovery from anxiety happens when symptoms decrease and stop interfering with your daily activities at home or work. This may mean that you will start to:  Have  better concentration and focus. Worry will interfere less in your daily thinking.  Sleep better.  Be less irritable.  Have more energy.  Have improved memory. It is important to recognize when your condition is getting worse. Contact your health care provider if your symptoms interfere with home or work and you feel like your condition is not improving. Follow these instructions at home: Activity  Exercise. Most adults should do the following: ? Exercise for at least 150 minutes each week. The exercise should increase your heart rate and make you sweat (moderate-intensity exercise). ? Strengthening exercises at least twice a week.  Get the right amount and quality of sleep. Most adults need 7-9 hours of sleep each night. Lifestyle   Eat a healthy diet that includes plenty of vegetables, fruits, whole grains, low-fat dairy products, and lean protein. Do not eat a lot of foods that  are high in solid fats, added sugars, or salt.  Make choices that simplify your life.  Do not use any products that contain nicotine or tobacco, such as cigarettes, e-cigarettes, and chewing tobacco. If you need help quitting, ask your health care provider.  Avoid caffeine, alcohol, and certain over-the-counter cold medicines. These may make you feel worse. Ask your pharmacist which medicines to avoid. General instructions  Take over-the-counter and prescription medicines only as told by your health care provider.  Keep all follow-up visits as told by your health care provider. This is important. Where to find support You can get help and support from these sources:  Self-help groups.  Online and [employer].  A trusted spiritual leader.  Couples counseling.  Family education classes.  Family therapy. Where to find more information You may find that joining a support group helps you deal with your anxiety. The following sources can help you locate counselors or support groups near  you:  Mental Health America: www.mentalhealthamerica.net  Anxiety and Depression Association of [place] (ADAA): ProgramCam.de  [employer] on Mental Illness (NAMI): www.nami.org Contact a health care provider if you:  Have a hard time staying focused or finishing daily tasks.  Spend many hours a day feeling worried about everyday life.  Become exhausted by worry.  Start to have headaches, feel tense, or have nausea.  Urinate more than normal.  Have diarrhea. Get help right away if you have:  A racing heart and shortness of breath.  Thoughts of hurting yourself or others. If you ever feel like you may hurt yourself or others, or have thoughts about taking your own life, get help right away. You can go to your nearest emergency department or call:  Your local emergency services (911 in the [place]  A suicide crisis helpline, such as the National Suicide Prevention Lifeline at 914-303-2391. This is open 24 hours a day. Summary  Taking steps to learn and use tension reduction techniques can help calm you and help prevent triggering an anxiety reaction.  When used together, medicines, psychotherapy, and tension reduction techniques may be the most effective treatment.  Family, friends, and partners can play a big part in helping you recover from an anxiety disorder. This information is not intended to replace advice given to you by your health care provider. Make sure you discuss any questions you have with your health care provider. Document Revised: 12/05/2018 Document Reviewed: 12/05/2018 Elsevier Patient Education  2020 [employer].

## 2019-11-14 ENCOUNTER — Ambulatory Visit: Payer: BLUE CROSS/BLUE SHIELD | Admitting: Internal Medicine

## 2019-11-14 ENCOUNTER — Other Ambulatory Visit: Payer: Self-pay

## 2019-11-14 ENCOUNTER — Encounter: Payer: Self-pay | Admitting: Internal Medicine

## 2019-11-14 VITALS — BP 110/80 | HR 76 | Temp 98.5°F | Ht 67.0 in | Wt 138.8 lb

## 2019-11-14 DIAGNOSIS — R0602 Shortness of breath: Secondary | ICD-10-CM | POA: Diagnosis not present

## 2019-11-14 MED ORDER — ALBUTEROL SULFATE HFA 108 (90 BASE) MCG/ACT IN AERS: 2.0000 | INHALATION_SPRAY | Freq: Four times a day (QID) | RESPIRATORY_TRACT | 3 refills | Status: AC | PRN

## 2019-11-14 NOTE — Progress Notes (Signed)
[facility]    [id number]    09/07/1995  Primary Care Physician:Kremer, Mortimer Fries, MD  Referring Physician: Libby Maw, MD [place],  [place] [id number] Reason for Consultation: shortness of breath Date of Consultation: 11/14/2019  Chief complaint:   Chief Complaint  Patient presents with  . Consult    gets sob while sitting for about a year.  Not constantly.  More out of breath than normal walking up steps or just walking.  Dry cough that comes and goes.     HPI: Shawn Garcia is a 24 y.o. man with history of LV non-compaction cardiomyopathy who presents for new patient evaluation of dyspnea. CM is managed on ARB and BB and annual exams with echocardiogram with [facility]. Cardiomyopathy was diagnosed when he was in high school and had syncopal episodes and chest pain while wrestling.   Notes about a year of worsening shortness of breath that is with rest. Feels worse with mask. Does not get short of breath predictably with exercise. Episodes last for seconds. Resolve spontaneously with purposeful slowing of breathing. He does have coughing and wheezing with movement.   As a child he notes breathing difficulties but isn't sure if this was related to his cardiomyopathy. Does not recall bronchitis. More frequently had episodes of chest pains.   Does have occasional itchy eyes and throat. +runny nose.   His girlfriend has asthma and he has used her inhaler. It seems to help her breathing.   He did have an anxiety attack last year - heart racing, difficulty breathing, impending doom  Per last cardiology note in Jan 2020 His symptoms of dyspnea and atypical chest pain are chronic, longstanding issues. His echocardiogram shows no changes from prior: normal biventricular function (NI>77%), normal diastolic function, no significant valvular dysfunction. He is low risk for cardiac complications during hernia surgery.   Social  history:  Occupation: Systems analyst at [employer].  Exposures: he is a English as a second language teacher, but no pets at home. Lives at home with girlfriend.  Smoking history: previously vaping. Quit 4 years ago.   Social History   Occupational History    Employer: [employer]  Tobacco Use  . Smoking status: Former Smoker    Quit date: 10/18/2016    Years since quitting: 3.0  . Smokeless tobacco: Never Used  Substance and Sexual Activity  . Alcohol use: Yes    Comment: occas  . Drug use: No  . Sexual activity: Never    Relevant family history:  Family History  Problem Relation Age of Onset  . Diabetes Maternal Grandmother   . Diabetes Maternal Grandfather     Past Medical History:  Diagnosis Date  . Dyspnea    dyspnea on exertion  . Left ventricular noncompaction cardiomyopathy ([place])   . Umbilical hernia     Past Surgical History:  Procedure Laterality Date  . INSERTION OF MESH N/A 08/08/2018   Procedure: INSERTION OF MESH;  Surgeon: Donnie Mesa, MD;  Location: [place];  Service: General;  Laterality: N/A;  . [place]     childhood he thinks and CT shows changes  . UMBILICAL HERNIA REPAIR N/A 08/08/2018   Procedure: HERNIA REPAIR UMBILICAL ADULT;  Surgeon: Donnie Mesa, MD;  Location: [place];  Service: General;  Laterality: N/A;    Review of systems: Review of Systems  Constitutional: Negative for chills, fever and weight loss.  HENT: Negative for  congestion, sinus pain and sore throat.   Eyes: Negative for discharge and redness.  Respiratory: Positive for cough, shortness of breath and wheezing. Negative for hemoptysis and sputum production.   Cardiovascular: Positive for chest pain. Negative for palpitations and leg swelling.  Gastrointestinal: Negative for heartburn, nausea and vomiting.  Musculoskeletal: Negative for joint pain and myalgias.  Skin: Negative for rash.  Neurological: Negative for dizziness, tremors, focal  weakness and headaches.  Endo/Heme/Allergies: Positive for environmental allergies.  Psychiatric/Behavioral: Negative for depression. The patient is not nervous/anxious.   All other systems reviewed and are negative.   Physical Exam: Blood pressure 110/80, pulse 76, temperature 98.5 F (36.9 C), temperature source Temporal, height 5\' 7"  (1.702 m), weight 138 lb 12.8 oz (63 kg), SpO2 97 %. Gen:      No acute distress Lungs:    No increased respiratory effort, symmetric chest wall excursion, clear to auscultation bilaterally, no wheezes or crackles CV:         Regular rate and rhythm; no murmurs, rubs, or gallops.  No pedal edema Abd:      + bowel sounds; soft, non-tender; no distension MSK: no acute synovitis of DIP or PIP joints, no mechanics hands.  Skin:      Warm and dry; no rashes Neuro: normal speech, no focal facial asymmetry Psych: alert and oriented x3, anxious   Data Reviewed/Medical Decision Making:  Independent interpretation of tests: Imaging: . Review of patient's chest xray June 2019 images revealed no acute cardiopulmonary process. The patient's images have been independently reviewed by me.    Echo Jan 2020 INTERPRETATION --------------------------------------------------------------- NORMAL LEFT VENTRICULAR SYSTOLIC FUNCTION NORMAL LA PRESSURES WITH NORMAL DIASTOLIC FUNCTION NORMAL RIGHT VENTRICULAR SYSTOLIC FUNCTION VALVULAR REGURGITATION: TRIVIAL MR, TRIVIAL PR, TRIVIAL TR NO VALVULAR STENOSIS LV EF AT LOWER LIMITS OF NORMAL NON COMPACTION 1.7CM TO 0.4CM RATIO 4:1 3D acquisition and reconstructions were performed as part of this examination to more accurately quantify the effects of identified structural abnormalities as part of the exam. (post-processing on an Independent workstation).  Compared with prior Echo study on 08/28/2015: FROM [facility], NO SIGNIFICANT CHANGE   PFTs: None on file  Labs:  Lab Results  Component Value Date   WBC 5.0  01/03/2019   HGB 13.5 01/03/2019   HCT 42.5 01/03/2019   MCV 74.8 (L) 01/03/2019   PLT 232 01/03/2019   Lab Results  Component Value Date   NA 139 01/03/2019   K 3.7 01/03/2019   CL 104 01/03/2019   CO2 26 01/03/2019   . I reviewed prior external note(s) from [facility] cardiology, peds cardiology, [facility]. [facility] . I reviewed the result(s) of the labs and imaging as noted above.  . I have ordered spirometry, exhaled NO   Assessment:  Mild intermittent asthma LV non-compaction cardiomyopathy Atypical chest pain  Plan/Recommendations: Sequence has episodic dyspnea which is not predictable and usually at rest.  Episodes last for seconds.  I am unclear if these symptoms could be related to asthma.  But he does have some improvement with his girlfriend albuterol.  Reasonable to give him a trial of albuterol.  We will also get spirometry next nitric oxide testing to evaluate further for asthma.  There are notes of recurrent chronic chest pain and episodic dyspnea at rest.  When I asked him about it more, he does mention having panic attacks.  He spoke with Dr. Doreene Burke about this getting into some talk therapy.  I think anxiety is certainly consideration for symptoms.  I  encouraged him to also speak with Dr. Farris Has about adding antianxiety medications in addition to start therapy.  We discussed disease management and progression at length today.    Return to Care: Return in about 6 weeks (around 12/26/2019).  Durel Salts, MD Pulmonary and Critical Care Medicine [facility] Office:[phone]  CC: Mliss Sax,*

## 2019-11-14 NOTE — Patient Instructions (Addendum)
The patient should have follow up scheduled with myself in 6 weeks  Prior to next visit patient should have: Spirometry/Feno  Follow up with your talk therapy and Dr. Doreene Burke about medications for anxiety.   Take the albuterol rescue inhaler every 4 to 6 hours as needed for wheezing or shortness of breath. You can also take it 15 minutes before exercise or exertional activity. Side effects include heart racing or pounding, jitters or anxiety. If you have a history of an irregular heart rhythm, it can make this worse. Can also give some patients a hard time sleeping.  To inhale the aerosol using an inhaler, follow these steps:  1. Remove the protective dust cap from the end of the mouthpiece. If the dust cap was not placed on the mouthpiece, check the mouthpiece for dirt or other objects. Be sure that the canister is fully and firmly inserted in the mouthpiece. 2. If you are using the inhaler for the first time or if you have not used the inhaler in more than 14 days, you will need to prime it. You may also need to prime the inhaler if it has been dropped. Ask your pharmacist or check the manufacturer's information if this happens. To prime the inhaler, shake it well and then press down on the canister 4 times to release 4 sprays into the air, away from your face. Be careful not to get albuterol in your eyes. 3. Shake the inhaler well. 4. Breathe out as completely as possible through your mouth. 4. Hold the canister with the mouthpiece on the bottom, facing you and the canister pointing upward. Place the open end of the mouthpiece into your mouth. Close your lips tightly around the mouthpiece. 6. Breathe in slowly and deeply through the mouthpiece.At the same time, press down once on the container to spray the medication into your mouth. 7. Try to hold your breath for 10 seconds. remove the inhaler, and breathe out slowly. 8. If you were told to use 2 puffs, wait 1 minute and then repeat steps  3-7. 9. Replace the protective cap on the inhaler. 10. Clean your inhaler regularly. Follow the manufacturer's directions carefully and ask your doctor or pharmacist if you have any questions about cleaning your inhaler.  Check the back of the inhaler to keep track of the total number of doses left on the inhaler.

## 2019-11-27 ENCOUNTER — Inpatient Hospital Stay (HOSPITAL_COMMUNITY)
Admission: RE | Admit: 2019-11-27 | Discharge: 2019-11-27 | Disposition: A | Discharge status: Home / Self Care | Payer: BLUE CROSS/BLUE SHIELD | Source: Ambulatory Visit

## 2019-11-27 NOTE — Progress Notes (Signed)
Patient was COVID positive initially on 09/30/19, patient does not need to be resteded before his procedure on 5/14   Lab Results - documented in this encounter  SARS-COV-2 RNA, QL, RT PCR (COVID-19) (09/30/2019 1:54 PM EDT) SARS-COV-2 RNA, QL, RT PCR (COVID-19) (09/30/2019 1:54 PM EDT)  Component Value Ref Range Performed At Pathologist Signature  SARS-CoV-2, NAA Detected (A) Comment:  Testing was performed using the cobas(R) SARS-CoV-2 test. This nucleic acid amplification test was developed and its performance characteristics determined by [employer]. Nucleic acid amplification tests include RT-PCR and TMA. This test has not been FDA cleared or approved. This test has been authorized by FDA under an Emergency Use Authorization (EUA). This test is only authorized for the duration of time the declaration that circumstances exist justifying the authorization of the emergency use of in vitro diagnostic tests for detection of SARS-CoV-2 virus and/or diagnosis of COVID-19 infection under section 564(b)(1) of the Act, 21 U.S.C. 767MCN-4(B) (1), unless the authorization is terminated or revoked sooner. When diagnostic testing is negative, the possibility of a false negative result should be considered in the context of a patient's recent exposures and the presence of clinical signs and symptoms consistent with COVID-19. An individual without symptoms of COVID-19 and who is not shedding SARS-CoV-2 virus would expect to have a negative (not detected) result in this assay. Not Detected [facility]    SARS-COV-2 RNA, QL, RT PCR (COVID-19) (09/30/2019 1:54 PM EDT)  Specimen     SARS-COV-2 RNA, QL, RT PCR (COVID-19) (09/30/2019 1:54 PM EDT)  Narrative  Verdell Carmine - 10/01/2019 8:07 AM EDT  Performed at: [address] [place] [address] [place], [place] [id number] Lab Director: Jolene Schimke MD, Phone: [phone]    SARS-COV-2 RNA, QL, RT PCR (COVID-19) (09/30/2019 1:54  PM EDT)  Performing Organization Address City/State/ZIP Code Phone Number  [facility]  [address]  [place], [place] [address]     [facility]

## 2019-11-30 ENCOUNTER — Other Ambulatory Visit: Payer: BLUE CROSS/BLUE SHIELD

## 2019-12-03 ENCOUNTER — Other Ambulatory Visit (HOSPITAL_COMMUNITY)
Admission: RE | Admit: 2019-12-03 | Discharge: 2019-12-03 | Disposition: A | Discharge status: Home / Self Care | Payer: BLUE CROSS/BLUE SHIELD | Source: Ambulatory Visit | Attending: Family Medicine | Admitting: Family Medicine

## 2019-12-03 ENCOUNTER — Other Ambulatory Visit: Payer: Self-pay

## 2019-12-03 ENCOUNTER — Ambulatory Visit (INDEPENDENT_AMBULATORY_CARE_PROVIDER_SITE_OTHER): Payer: BLUE CROSS/BLUE SHIELD | Admitting: Family Medicine

## 2019-12-03 ENCOUNTER — Encounter: Payer: Self-pay | Admitting: Family Medicine

## 2019-12-03 VITALS — BP 110/62 | HR 73 | Temp 97.8°F | Ht 67.0 in | Wt 141.2 lb

## 2019-12-03 DIAGNOSIS — Z0000 Encounter for general adult medical examination without abnormal findings: Secondary | ICD-10-CM

## 2019-12-03 DIAGNOSIS — F418 Other specified anxiety disorders: Secondary | ICD-10-CM

## 2019-12-03 DIAGNOSIS — N451 Epididymitis: Secondary | ICD-10-CM | POA: Insufficient documentation

## 2019-12-03 LAB — CBC
HCT: 42.4 % (ref 39.0–52.0)
Hemoglobin: 13.5 g/dL (ref 13.0–17.0)
MCHC: 31.8 g/dL (ref 30.0–36.0)
MCV: 73.6 fl — ABNORMAL LOW (ref 78.0–100.0)
Platelets: 243 10*3/uL (ref 150.0–400.0)
RBC: 5.76 Mil/uL (ref 4.22–5.81)
RDW: 14.3 % (ref 11.5–15.5)
WBC: 3.2 10*3/uL — ABNORMAL LOW (ref 4.0–10.5)

## 2019-12-03 LAB — COMPREHENSIVE METABOLIC PANEL
ALT: 11 U/L (ref 0–53)
AST: 16 U/L (ref 0–37)
Albumin: 4.6 g/dL (ref 3.5–5.2)
Alkaline Phosphatase: 60 U/L (ref 39–117)
BUN: 12 mg/dL (ref 6–23)
CO2: 29 mEq/L (ref 19–32)
Calcium: 9.5 mg/dL (ref 8.4–10.5)
Chloride: 104 mEq/L (ref 96–112)
Creatinine, Ser: 0.95 mg/dL (ref 0.40–1.50)
GFR: 116.88 mL/min (ref >60.00)
Glucose, Bld: 92 mg/dL (ref 70–99)
Potassium: 4.5 mEq/L (ref 3.5–5.1)
Sodium: 139 mEq/L (ref 135–145)
Total Bilirubin: 0.7 mg/dL (ref 0.2–1.2)
Total Protein: 7.2 g/dL (ref 6.0–8.3)

## 2019-12-03 LAB — URINALYSIS, ROUTINE W REFLEX MICROSCOPIC
Bilirubin Urine: NEGATIVE
Hgb urine dipstick: NEGATIVE
Ketones, ur: NEGATIVE
Leukocytes,Ua: NEGATIVE
Nitrite: NEGATIVE
RBC / HPF: NONE SEEN (ref 0–?)
Specific Gravity, Urine: 1.025 (ref 1.000–1.030)
Total Protein, Urine: NEGATIVE
Urine Glucose: NEGATIVE
Urobilinogen, UA: 0.2 (ref 0.0–1.0)
WBC, UA: NONE SEEN (ref 0–?)
pH: 8 (ref 5.0–8.0)

## 2019-12-03 LAB — LIPID PANEL
Cholesterol: 126 mg/dL (ref 0–200)
HDL: 49.6 mg/dL (ref >39.00)
LDL Cholesterol: 70 mg/dL (ref 0–99)
NonHDL: 76.72
Total CHOL/HDL Ratio: 3
Triglycerides: 36 mg/dL (ref 0.0–149.0)
VLDL: 7.2 mg/dL (ref 0.0–40.0)

## 2019-12-03 NOTE — Patient Instructions (Addendum)
Epididymitis  Epididymitis is swelling (inflammation) or infection of the epididymis. The epididymis is a cord-like structure that is located along the top and back part of the testicle. It collects and stores sperm from the testicle. This condition can also cause pain and swelling of the testicle and scrotum. Symptoms usually start suddenly (acute epididymitis). Sometimes epididymitis starts gradually and lasts for a while (chronic epididymitis). This type may be harder to treat. What are the causes? In men ages 20-40, this condition is usually caused by a bacterial infection or a sexually transmitted disease (STD), such as:  Gonorrhea.  Chlamydia. In men 40 and older who do not have anal sex, this condition is usually caused by bacteria from a blockage or from abnormalities in the urinary system. These can result from:  Having a tube placed into the bladder (urinary catheter).  Having an enlarged or inflamed prostate gland.  Having recently had urinary tract surgery.  Having a problem with a backward flow of urine (retrograde). In men who have a condition that weakens the body's defense system (immune system), such as HIV, this condition can be caused by:  Other bacteria, including tuberculosis and syphilis.  Viruses.  Fungi. Sometimes this condition occurs without infection. This may happen because of trauma or repetitive activities such as sports. What increases the risk? You are more likely to develop this condition if you have:  Unprotected sex with more than one partner.  Anal sex.  Recently had surgery.  A urinary catheter.  Urinary problems.  A suppressed immune system. What are the signs or symptoms? This condition usually begins suddenly with chills, fever, and pain behind the scrotum and in the testicle. Other symptoms include:  Swelling of the scrotum, testicle, or both.  Pain when ejaculating or urinating.  Pain in the back or  abdomen.  Nausea.  Itching and discharge from the penis.  A frequent need to pass urine.  Redness, increased warmth, and tenderness of the scrotum. How is this diagnosed? Your health care provider can diagnose this condition based on your symptoms and medical history. Your health care provider will also do a physical exam to ask about your symptoms and check your scrotum and testicle for swelling, pain, and redness. You may also have other tests, including:  Examination of discharge from the penis.  Urine tests for infections, such as STDs.  Ultrasound test for blood flow and inflammation. Your health care provider may test you for other STDs, including HIV. How is this treated? Treatment for this condition depends on the cause. If your condition is caused by a bacterial infection, oral antibiotic medicine may be prescribed. If the bacterial infection has spread to your blood, you may need to receive IV antibiotics. For both bacterial and nonbacterial epididymitis, you may be treated with:  Rest.  Elevation of the scrotum.  Pain medicines.  Anti-inflammatory medicines. Surgery may be needed to treat:  Bacterial epididymitis that causes pus to build up in the scrotum (abscess).  Chronic epididymitis that has not responded to other treatments. Follow these instructions at home: Medicines  Take over-the-counter and prescription medicines only as told by your health care provider.  If you were prescribed an antibiotic medicine, take it as told by your health care provider. Do not stop taking the antibiotic even if your condition improves. Sexual activity  If your epididymitis was caused by an STD, avoid sexual activity until your treatment is complete.  Inform your sexual partner or partners if you test positive for   an STD. They may need to be treated. Do not engage in sexual activity with your partner or partners until their treatment is completed. Managing pain and  swelling   If directed, elevate your scrotum and apply ice. ? Put ice in a plastic bag. ? Place a small towel or pillow between your legs. ? Rest your scrotum on the pillow or towel. ? Place another towel between your skin and the plastic bag. ? Leave the ice on for 20 minutes, 2-3 times a day.  Try taking a sitz bath to help with discomfort. This is a warm water bath that is taken while you are sitting down. The water should only come up to your hips and should cover your buttocks. Do this 3-4 times per day or as told by your health care provider.  Keep your scrotum elevated and supported while resting. Ask your health care provider if you should wear a scrotal support, such as a jockstrap. Wear it as told by your health care provider. General instructions  Return to your normal activities as told by your health care provider. Ask your health care provider what activities are safe for you.  Drink enough fluid to keep your urine pale yellow.  Keep all follow-up visits as told by your health care provider. This is important. Contact a health care provider if:  You have a fever.  Your pain medicine is not helping.  Your pain is getting worse.  Your symptoms do not improve within 3 days. Summary  Epididymitis is swelling (inflammation) or infection of the epididymis. This condition can also cause pain and swelling of the testicle and scrotum.  Treatment for this condition depends on the cause. If your condition is caused by a bacterial infection, oral antibiotic medicine may be prescribed.  Inform your sexual partner or partners if you test positive for an STD. They may need to be treated. Do not engage in sexual activity with your partner or partners until their treatment is completed.  Contact a health care provider if your symptoms do not improve within 3 days. This information is not intended to replace advice given to you by your health care provider. Make sure you discuss any  questions you have with your health care provider. Document Revised: 05/08/2018 Document Reviewed: 05/09/2018 Elsevier Patient Education  2020 Elsevier Inc.  

## 2019-12-03 NOTE — Progress Notes (Addendum)
Established Patient Office Visit  Subjective:  Patient ID: Shawn Garcia, male    DOB: 1995-11-14  Age: 24 y.o. MRN: [id number]  CC:  Chief Complaint  Patient presents with  . Pain    pain right side of groan area and right testicle x 2 -3 days.     HPI Shawn Garcia presents for evaluation and treatment of a 2 to 3-day history of some discomfort that is intermittent in the right testicle, tubes and groin area.  There is been no dysuria, hematuria or increased urinary frequency.  He has no history of kidney stone but his mom was recently diagnosed with 1.  He is fasting today.  Past Medical History:  Diagnosis Date  . Dyspnea    dyspnea on exertion  . Left ventricular noncompaction cardiomyopathy (HCC)   . Umbilical hernia     Past Surgical History:  Procedure Laterality Date  . INSERTION OF MESH N/A 08/08/2018   Procedure: INSERTION OF MESH;  Surgeon: Manus Rudd, MD;  Location: [facility];  Service: General;  Laterality: N/A;  . UMBILICAL HERNIA REPAIR     childhood he thinks and CT shows changes  . UMBILICAL HERNIA REPAIR N/A 08/08/2018   Procedure: HERNIA REPAIR UMBILICAL ADULT;  Surgeon: Manus Rudd, MD;  Location: [facility];  Service: General;  Laterality: N/A;    Family History  Problem Relation Age of Onset  . Diabetes Maternal Grandmother   . Diabetes Maternal Grandfather     Social History   Socioeconomic History  . Marital status: Single    Spouse name: Not on file  . Number of children: Not on file  . Years of education: Not on file  . Highest education level: Not on file  Occupational History    Employer: [employer]  Tobacco Use  . Smoking status: Former Smoker    Quit date: 10/18/2016    Years since quitting: 3.1  . Smokeless tobacco: Never Used  Substance and Sexual Activity  . Alcohol use: Yes    Comment: occas  . Drug use: No  . Sexual activity: Never  Other Topics Concern  . Not on file  Social  History Narrative   Single, no kids   EtOH 2x/week   No tobacco or drug use   Working at [employer]   Also a Comptroller    Social Determinants of Corporate investment banker Strain:   . Difficulty of Paying Living Expenses:   Food Insecurity:   . Worried About Programme researcher, broadcasting/film/video in the Last Year:   . Barista in the Last Year:   Transportation Needs:   . Freight forwarder (Medical):   Marland Kitchen Lack of Transportation (Non-Medical):   Physical Activity:   . Days of Exercise per Week:   . Minutes of Exercise per Session:   Stress:   . Feeling of Stress :   Social Connections:   . Frequency of Communication with Friends and Family:   . Frequency of Social Gatherings with Friends and Family:   . Attends Religious Services:   . Active Member of Clubs or Organizations:   . Attends Banker Meetings:   Marland Kitchen Marital Status:   Intimate Partner Violence:   . Fear of Current or Ex-Partner:   . Emotionally Abused:   Marland Kitchen Physically Abused:   . Sexually Abused:     Outpatient Medications Prior to Visit  Medication Sig Dispense Refill  .  albuterol (VENTOLIN HFA) 108 (90 Base) MCG/ACT inhaler Inhale 2 puffs into the lungs every 6 (six) hours as needed for wheezing or shortness of breath. 18 g 3  . Ascorbic Acid (VITAMIN C) 1000 MG tablet Take by mouth.    . Cholecalciferol (VITAMIN D3 PO) Take by mouth.    . ELDERBERRY PO Take by mouth.    . famotidine (PEPCID) 40 MG tablet Take by mouth.    Marland Kitchen GLUCOSAMINE SULFATE PO Take by mouth.    . losartan (COZAAR) 25 MG tablet Take 25 mg by mouth daily.    . metoprolol succinate (TOPROL-XL) 25 MG 24 hr tablet Take 25 mg by mouth daily.    . Multiple Vitamin (MULTI-DAY VITAMINS) TABS Take by mouth.    . TURMERIC PO Take by mouth.     No facility-administered medications prior to visit.    No Known Allergies  ROS Review of Systems  Constitutional: Negative.  Negative for chills and fever.  Respiratory: Negative.     Cardiovascular: Negative.   Gastrointestinal: Negative.  Negative for diarrhea and vomiting.  Endocrine: Negative for polyphagia and polyuria.  Genitourinary: Positive for testicular pain. Negative for decreased urine volume, difficulty urinating, discharge, dysuria, frequency, hematuria and urgency.  Musculoskeletal: Negative for arthralgias, gait problem and joint swelling.  Allergic/Immunologic: Negative for immunocompromised state.  Neurological: Negative.   Hematological: Does not bruise/bleed easily.  Psychiatric/Behavioral: The patient is nervous/anxious.       Objective:    Physical Exam  Constitutional: He is oriented to person, place, and time. He appears well-developed and well-nourished. No distress.  HENT:  Head: Normocephalic and atraumatic.  Right Ear: External ear normal.  Left Ear: External ear normal.  Eyes: Conjunctivae are normal. Right eye exhibits no discharge. Left eye exhibits no discharge. No scleral icterus.  Neck: No JVD present. No tracheal deviation present.  Cardiovascular:  Pulses:      Femoral pulses are 2+ on the right side. Pulmonary/Chest: Effort normal. No stridor.  Genitourinary:    Right testis shows no mass, no swelling and no tenderness. Left testis shows no mass, no swelling and no tenderness. Circumcised. No hypospadias, penile erythema or penile tenderness. No discharge found.  Musculoskeletal:     Right hip: Normal range of motion. Normal strength.     Left hip: Normal range of motion. Normal strength.  Neurological: He is alert and oriented to person, place, and time.  Skin: He is not diaphoretic.  Psychiatric: He has a normal mood and affect. His behavior is normal.    BP 110/62   Pulse 73   Temp 97.8 F (36.6 C) (Tympanic)   Ht 5\' 7"  (1.702 m)   Wt 141 lb 3.2 oz (64 kg)   SpO2 97%   BMI 22.12 kg/m  Wt Readings from Last 3 Encounters:  12/03/19 141 lb 3.2 oz (64 kg)  11/14/19 138 lb 12.8 oz (63 kg)  11/12/19 138 lb 9.6  oz (62.9 kg)     Health Maintenance Due  Topic Date Due  . COVID-19 Vaccine (1) Never done    There are no preventive care reminders to display for this patient.  Lab Results  Component Value Date   TSH 0.72 12/03/2019   Lab Results  Component Value Date   WBC 3.2 (L) 12/03/2019   HGB 13.5 12/03/2019   HCT 42.4 12/03/2019   MCV 73.6 (L) 12/03/2019   PLT 243.0 12/03/2019   Lab Results  Component Value Date   NA 139 12/03/2019  K 4.5 12/03/2019   CO2 29 12/03/2019   GLUCOSE 92 12/03/2019   BUN 12 12/03/2019   CREATININE 0.95 12/03/2019   BILITOT 0.7 12/03/2019   ALKPHOS 60 12/03/2019   AST 16 12/03/2019   ALT 11 12/03/2019   PROT 7.2 12/03/2019   ALBUMIN 4.6 12/03/2019   CALCIUM 9.5 12/03/2019   ANIONGAP 9 01/03/2019   GFR 116.88 12/03/2019   Lab Results  Component Value Date   CHOL 126 12/03/2019   Lab Results  Component Value Date   HDL 49.60 12/03/2019   Lab Results  Component Value Date   LDLCALC 70 12/03/2019   Lab Results  Component Value Date   TRIG 36.0 12/03/2019   Lab Results  Component Value Date   CHOLHDL 3 12/03/2019   No results found for: HGBA1C    Assessment & Plan:   Problem List Items Addressed This Visit      Genitourinary   Epididymitis, right   Relevant Medications   doxycycline (VIBRA-TABS) 100 MG tablet   Other Relevant Orders   RPR (Completed)   Urine cytology ancillary only (Completed)     Other   Anxiety with depression   Healthcare maintenance - Primary      Meds ordered this encounter  Medications  . doxycycline (VIBRA-TABS) 100 MG tablet    Sig: Take 1 tablet (100 mg total) by mouth 2 (two) times daily.    Dispense:  20 tablet    Refill:  0    Follow-up: Return if symptoms worsen or fail to improve, for to er if pain increases. Mliss Sax, MD

## 2019-12-04 LAB — URINE CYTOLOGY ANCILLARY ONLY
Chlamydia: NEGATIVE
Comment: NEGATIVE
Comment: NORMAL
Neisseria Gonorrhea: NEGATIVE

## 2019-12-04 LAB — RPR: RPR Ser Ql: NONREACTIVE

## 2019-12-04 LAB — HIV ANTIBODY (ROUTINE TESTING W REFLEX): HIV 1&2 Ab, 4th Generation: NONREACTIVE

## 2019-12-04 LAB — TSH: TSH: 0.72 u[IU]/mL (ref 0.35–4.50)

## 2019-12-04 MED ORDER — DOXYCYCLINE HYCLATE 100 MG PO TABS: 100.0000 mg | ORAL_TABLET | Freq: Two times a day (BID) | ORAL | 0 refills | Status: DC

## 2019-12-04 NOTE — Addendum Note (Signed)
Addended by: Andrez Grime on: 12/04/2019 04:51 PM   Modules accepted: Orders

## 2019-12-16 ENCOUNTER — Other Ambulatory Visit: Payer: Self-pay | Admitting: Family Medicine

## 2019-12-16 DIAGNOSIS — N451 Epididymitis: Secondary | ICD-10-CM

## 2019-12-18 ENCOUNTER — Ambulatory Visit: Payer: BLUE CROSS/BLUE SHIELD | Admitting: Internal Medicine

## 2019-12-28 ENCOUNTER — Ambulatory Visit: Payer: BLUE CROSS/BLUE SHIELD | Admitting: Internal Medicine

## 2020-01-06 ENCOUNTER — Other Ambulatory Visit: Payer: Self-pay | Admitting: Family Medicine

## 2020-01-06 DIAGNOSIS — N451 Epididymitis: Secondary | ICD-10-CM

## 2020-01-15 ENCOUNTER — Other Ambulatory Visit (HOSPITAL_COMMUNITY): Payer: BLUE CROSS/BLUE SHIELD

## 2020-01-18 ENCOUNTER — Other Ambulatory Visit: Payer: BLUE CROSS/BLUE SHIELD

## 2020-02-02 ENCOUNTER — Other Ambulatory Visit: Payer: Self-pay | Admitting: Family Medicine

## 2020-02-02 DIAGNOSIS — N451 Epididymitis: Secondary | ICD-10-CM

## 2020-02-06 ENCOUNTER — Telehealth: Payer: Self-pay | Admitting: Family Medicine

## 2020-02-06 NOTE — Telephone Encounter (Signed)
Patient requests refill of doxycycline. He states he feeling better when on it, then finished regimen and started to decline again. He would like to re-start medication.

## 2020-02-11 ENCOUNTER — Ambulatory Visit: Payer: BLUE CROSS/BLUE SHIELD | Admitting: Family Medicine

## 2020-02-18 ENCOUNTER — Encounter: Payer: Self-pay | Admitting: Family Medicine

## 2020-02-18 ENCOUNTER — Other Ambulatory Visit: Payer: Self-pay

## 2020-02-18 ENCOUNTER — Inpatient Hospital Stay (HOSPITAL_COMMUNITY): Admit: 2020-02-18 | Payer: BLUE CROSS/BLUE SHIELD

## 2020-02-18 ENCOUNTER — Ambulatory Visit (INDEPENDENT_AMBULATORY_CARE_PROVIDER_SITE_OTHER): Payer: BLUE CROSS/BLUE SHIELD | Admitting: Family Medicine

## 2020-02-18 VITALS — BP 120/78 | HR 82 | Temp 97.8°F | Ht 67.0 in | Wt 142.8 lb

## 2020-02-18 DIAGNOSIS — N451 Epididymitis: Secondary | ICD-10-CM | POA: Diagnosis not present

## 2020-02-18 MED ORDER — METRONIDAZOLE 500 MG PO TABS: ORAL_TABLET | ORAL | 0 refills | Status: AC

## 2020-02-18 NOTE — Progress Notes (Signed)
Established Patient Office Visit  Subjective:  Patient ID: Shawn Garcia, male    DOB: [birth year]  Age: 24 y.o. MRN: [id number]  CC:  Chief Complaint  Patient presents with  . Follow-up    follow up on testicle discomfort    HPI [name] presents for follow-up of intermittent right testicular discomfort.  Comes and goes.  Seem to be doing better while he was taking the Doxy.  He did finish the course of therapy.  Denies discharge dysuria or rash.  He has been with the same partner for the last 5 years.  She is having no symptoms.  Prior testing was negative.  Past Medical History:  Diagnosis Date  . Dyspnea    dyspnea on exertion  . Left ventricular noncompaction cardiomyopathy (HCC)   . Umbilical hernia     Past Surgical History:  Procedure Laterality Date  . INSERTION OF MESH N/A 08/08/2018   Procedure: INSERTION OF MESH;  Surgeon: Manus Rudd, MD;  Location: [facility];  Service: General;  Laterality: N/A;  . UMBILICAL HERNIA REPAIR     childhood he thinks and CT shows changes  . UMBILICAL HERNIA REPAIR N/A 08/08/2018   Procedure: HERNIA REPAIR UMBILICAL ADULT;  Surgeon: Manus Rudd, MD;  Location: [facility];  Service: General;  Laterality: N/A;    Family History  Problem Relation Age of Onset  . Diabetes Maternal Grandmother   . Diabetes Maternal Grandfather     Social History   Socioeconomic History  . Marital status: Single    Spouse name: Not on file  . Number of children: Not on file  . Years of education: Not on file  . Highest education level: Not on file  Occupational History    Employer: [employer]  Tobacco Use  . Smoking status: Former Smoker    Quit date: 10/18/2016    Years since quitting: 3.3  . Smokeless tobacco: Never Used  Vaping Use  . Vaping Use: Former  . Start date: 07/19/2014  . Quit date: 07/19/2016  Substance and Sexual Activity  . Alcohol use: Yes    Comment: occas  . Drug use: No  .  Sexual activity: Never  Other Topics Concern  . Not on file  Social History Narrative   Single, no kids   EtOH 2x/week   No tobacco or drug use   Working at [employer]   Also a Comptroller    Social Determinants of Corporate investment banker Strain:   . Difficulty of Paying Living Expenses:   Food Insecurity:   . Worried About Programme researcher, broadcasting/film/video in the Last Year:   . Barista in the Last Year:   Transportation Needs:   . Freight forwarder (Medical):   Marland Kitchen Lack of Transportation (Non-Medical):   Physical Activity:   . Days of Exercise per Week:   . Minutes of Exercise per Session:   Stress:   . Feeling of Stress :   Social Connections:   . Frequency of Communication with Friends and Family:   . Frequency of Social Gatherings with Friends and Family:   . Attends Religious Services:   . Active Member of Clubs or Organizations:   . Attends Banker Meetings:   Marland Kitchen Marital Status:   Intimate Partner Violence:   . Fear of Current or Ex-Partner:   . Emotionally Abused:   Marland Kitchen Physically Abused:   . Sexually Abused:  Outpatient Medications Prior to Visit  Medication Sig Dispense Refill  . albuterol (VENTOLIN HFA) 108 (90 Base) MCG/ACT inhaler Inhale 2 puffs into the lungs every 6 (six) hours as needed for wheezing or shortness of breath. 18 g 3  . Ascorbic Acid (VITAMIN C) 1000 MG tablet Take by mouth.    . Cholecalciferol (VITAMIN D3 PO) Take by mouth.    . ELDERBERRY PO Take by mouth.    . famotidine (PEPCID) 40 MG tablet Take by mouth.    Marland Kitchen GLUCOSAMINE SULFATE PO Take by mouth.    . losartan (COZAAR) 25 MG tablet Take 25 mg by mouth daily.    . metoprolol succinate (TOPROL-XL) 25 MG 24 hr tablet Take 25 mg by mouth daily.    . Multiple Vitamin (MULTI-DAY VITAMINS) TABS Take by mouth.    . TURMERIC PO Take by mouth.    . doxycycline (VIBRA-TABS) 100 MG tablet Take 1 tablet by mouth twice daily (Patient not taking: Reported on 02/18/2020) 20  tablet 0   No facility-administered medications prior to visit.    No Known Allergies  ROS Review of Systems  Constitutional: Negative.   Respiratory: Negative.   Cardiovascular: Negative.   Gastrointestinal: Negative.   Genitourinary: Positive for testicular pain. Negative for decreased urine volume, discharge, dysuria, frequency, genital sores, hematuria, penile pain, penile swelling, scrotal swelling and urgency.  Musculoskeletal: Negative.   Psychiatric/Behavioral: Negative.       Objective:    Physical Exam Vitals and nursing note reviewed.  Constitutional:      Appearance: Normal appearance. He is normal weight.  HENT:     Head: Normocephalic and atraumatic.     Right Ear: External ear normal.     Left Ear: External ear normal.  Eyes:     General: No scleral icterus.       Right eye: No discharge.        Left eye: No discharge.     Conjunctiva/sclera: Conjunctivae normal.  Cardiovascular:     Rate and Rhythm: Normal rate and regular rhythm.  Pulmonary:     Effort: Pulmonary effort is normal.     Breath sounds: Normal breath sounds.  Abdominal:     Hernia: There is no hernia in the left inguinal area or right inguinal area.  Genitourinary:    Penis: Circumcised. No hypospadias, erythema, tenderness, discharge, swelling or lesions.      Testes:        Right: Mass, tenderness or swelling not present. Right testis is descended.        Left: Mass, tenderness or swelling not present. Left testis is descended.     Epididymis:     Right: Not inflamed or enlarged. No mass or tenderness.     Left: Not inflamed or enlarged. No mass or tenderness.  Lymphadenopathy:     Lower Body: No right inguinal adenopathy. No left inguinal adenopathy.  Neurological:     Mental Status: He is alert.  Psychiatric:        Mood and Affect: Mood normal.        Behavior: Behavior normal.     BP 120/78   Pulse 82   Temp 97.8 F (36.6 C) (Tympanic)   Ht 5\' 7"  (1.702 m)   Wt 142 lb  12.8 oz (64.8 kg)   SpO2 97%   BMI 22.37 kg/m  Wt Readings from Last 3 Encounters:  02/18/20 142 lb 12.8 oz (64.8 kg)  12/03/19 141 lb 3.2 oz (64 kg)  11/14/19 138 lb 12.8 oz (63 kg)     Health Maintenance Due  Topic Date Due  . Hepatitis C Screening  Never done  . COVID-19 Vaccine (1) Never done  . INFLUENZA VACCINE  02/17/2020    There are no preventive care reminders to display for this patient.  Lab Results  Component Value Date   TSH 0.72 12/03/2019   Lab Results  Component Value Date   WBC 3.2 (L) 12/03/2019   HGB 13.5 12/03/2019   HCT 42.4 12/03/2019   MCV 73.6 (L) 12/03/2019   PLT 243.0 12/03/2019   Lab Results  Component Value Date   NA 139 12/03/2019   K 4.5 12/03/2019   CO2 29 12/03/2019   GLUCOSE 92 12/03/2019   BUN 12 12/03/2019   CREATININE 0.95 12/03/2019   BILITOT 0.7 12/03/2019   ALKPHOS 60 12/03/2019   AST 16 12/03/2019   ALT 11 12/03/2019   PROT 7.2 12/03/2019   ALBUMIN 4.6 12/03/2019   CALCIUM 9.5 12/03/2019   ANIONGAP 9 01/03/2019   GFR 116.88 12/03/2019   Lab Results  Component Value Date   CHOL 126 12/03/2019   Lab Results  Component Value Date   HDL 49.60 12/03/2019   Lab Results  Component Value Date   LDLCALC 70 12/03/2019   Lab Results  Component Value Date   TRIG 36.0 12/03/2019   Lab Results  Component Value Date   CHOLHDL 3 12/03/2019   No results found for: HGBA1C    Assessment & Plan:   Problem List Items Addressed This Visit      Genitourinary   Epididymitis, right - Primary   Relevant Medications   metroNIDAZOLE (FLAGYL) 500 MG tablet   Other Relevant Orders   Urine cytology ancillary only      Meds ordered this encounter  Medications  . metroNIDAZOLE (FLAGYL) 500 MG tablet    Sig: Take 4 pills at once with food. No alcohol within 24 hours of taking medicine.    Dispense:  4 tablet    Refill:  0    Follow-up: Return another round of doxy if symptoms do not resolve..   Information given on  Flagyl.  Alcohol warnings given. Mliss Sax, MD

## 2020-02-18 NOTE — Patient Instructions (Signed)
Metronidazole tablets or capsules What is this medicine? METRONIDAZOLE (me troe NI da zole) is an antiinfective. It is used to treat certain kinds of bacterial and protozoal infections. It will not work for colds, flu, or other viral infections. This medicine may be used for other purposes; ask your health care provider or pharmacist if you have questions. COMMON BRAND NAME(S): Flagyl What should I tell my health care provider before I take this medicine? They need to know if you have any of these conditions:  Cockayne syndrome  history of blood diseases, like sickle cell anemia or leukemia  history of yeast infection  if you often drink alcohol  liver disease  an unusual or allergic reaction to metronidazole, nitroimidazoles, or other medicines, foods, dyes, or preservatives  pregnant or trying to get pregnant  breast-feeding How should I use this medicine? Take this medicine by mouth with a full glass of water. Follow the directions on the prescription label. Take your medicine at regular intervals. Do not take your medicine more often than directed. Take all of your medicine as directed even if you think you are better. Do not skip doses or stop your medicine early. Talk to your pediatrician regarding the use of this medicine in children. Special care may be needed. Overdosage: If you think you have taken too much of this medicine contact a poison control center or emergency room at once. NOTE: This medicine is only for you. Do not share this medicine with others. What if I miss a dose? If you miss a dose, take it as soon as you can. If it is almost time for your next dose, take only that dose. Do not take double or extra doses. What may interact with this medicine? Do not take this medicine with any of the following medications:  alcohol or any product that contains alcohol  cisapride  disulfiram  dronedarone  pimozide  thioridazine This medicine may also interact with  the following medications:  amiodarone  birth control pills  busulfan  carbamazepine  cimetidine  cyclosporine  fluorouracil  lithium  other medicines that prolong the QT interval (cause an abnormal heart rhythm) like dofetilide, ziprasidone  phenobarbital  phenytoin  quinidine  tacrolimus  vecuronium  warfarin This list may not describe all possible interactions. Give your health care provider a list of all the medicines, herbs, non-prescription drugs, or dietary supplements you use. Also tell them if you smoke, drink alcohol, or use illegal drugs. Some items may interact with your medicine. What should I watch for while using this medicine? Tell your doctor or health care professional if your symptoms do not improve or if they get worse. You may get drowsy or dizzy. Do not drive, use machinery, or do anything that needs mental alertness until you know how this medicine affects you. Do not stand or sit up quickly, especially if you are an older patient. This reduces the risk of dizzy or fainting spells. Ask your doctor or health care professional if you should avoid alcohol. Many nonprescription cough and cold products contain alcohol. Metronidazole can cause an unpleasant reaction when taken with alcohol. The reaction includes flushing, headache, nausea, vomiting, sweating, and increased thirst. The reaction can last from 30 minutes to several hours. If you are being treated for a sexually transmitted disease, avoid sexual contact until you have finished your treatment. Your sexual partner may also need treatment. What side effects may I notice from receiving this medicine? Side effects that you should report to   your doctor or health care professional as soon as possible:  allergic reactions like skin rash or hives, swelling of the face, lips, or tongue  confusion  fast, irregular heartbeat  fever, chills, sore throat  fever with rash, swollen lymph nodes, or  swelling of the face  pain, tingling, numbness in the hands or feet  redness, blistering, peeling or loosening of the skin, including inside the mouth  seizures  sign and symptoms of liver injury like dark yellow or brown urine; general ill feeling or flu-like symptoms; light colored stools; loss of appetite; nausea; right upper belly pain; unusually weak or tired; yellowing of the eyes or skin  vaginal discharge, itching, or odor in women Side effects that usually do not require medical attention (report to your doctor or health care professional if they continue or are bothersome):  changes in taste  diarrhea  headache  nausea, vomiting  stomach pain This list may not describe all possible side effects. Call your doctor for medical advice about side effects. You may report side effects to FDA at 1-800-FDA-1088. Where should I keep my medicine? Keep out of the reach of children. Store at room temperature below 25 degrees C (77 degrees F). Protect from light. Keep container tightly closed. Throw away any unused medicine after the expiration date. NOTE: This sheet is a summary. It may not cover all possible information. If you have questions about this medicine, talk to your doctor, pharmacist, or health care provider.  2020 Elsevier/Gold Standard (2018-06-27 06:52:33)  

## 2020-02-19 LAB — URINE CYTOLOGY ANCILLARY ONLY
Bacterial Vaginitis-Urine: NEGATIVE
Chlamydia: NEGATIVE
Comment: NEGATIVE
Comment: NEGATIVE
Trichomonas: NEGATIVE

## 2020-06-17 ENCOUNTER — Ambulatory Visit (INDEPENDENT_AMBULATORY_CARE_PROVIDER_SITE_OTHER): Payer: BLUE CROSS/BLUE SHIELD | Admitting: Family

## 2020-06-17 ENCOUNTER — Encounter: Payer: Self-pay | Admitting: Family

## 2020-06-17 ENCOUNTER — Other Ambulatory Visit: Payer: Self-pay

## 2020-06-17 VITALS — BP 100/80 | HR 83 | Temp 97.5°F | Ht 67.0 in | Wt 146.2 lb

## 2020-06-17 DIAGNOSIS — G44209 Tension-type headache, unspecified, not intractable: Secondary | ICD-10-CM | POA: Diagnosis not present

## 2020-06-17 DIAGNOSIS — F430 Acute stress reaction: Secondary | ICD-10-CM

## 2020-06-17 NOTE — Patient Instructions (Addendum)
Chesley Noon, [employer]   [email or web address]  Managing Stress, Adult Feeling a certain amount of stress is normal. Stress helps our body and mind get ready to deal with the demands of life. Stress hormones can motivate you to do well at work and meet your responsibilities. However severe or long-lasting (chronic) stress can affect your mental and physical health. Chronic stress puts you at higher risk for anxiety, depression, and other health problems like digestive problems, muscle aches, heart disease, high blood pressure, and stroke. What are the causes? Common causes of stress include:  Demands from work, such as deadlines, feeling overworked, or having long hours.  Pressures at home, such as money issues, disagreements with a spouse, or parenting issues.  Pressures from major life changes, such as divorce, moving, loss of a loved one, or chronic illness. You may be at higher risk for stress-related problems if you do not get enough sleep, are in poor health, do not have emotional support, or have a mental health disorder like anxiety or depression. How to recognize stress Stress can make you:  Have trouble sleeping.  Feel sad, anxious, irritable, or overwhelmed.  Lose your appetite.  Overeat or want to eat unhealthy foods.  Want to use drugs or alcohol. Stress can also cause physical symptoms, such as:  Sore, tense muscles, especially in the shoulders and neck.  Headaches.  Trouble breathing.  A faster heart rate.  Stomach pain, nausea, or vomiting.  Diarrhea or constipation.  Trouble concentrating. Follow these instructions at home: Lifestyle  Identify the source of your stress and your reaction to it. See a therapist who can help you change your reactions.  When there are stressful events: ? Talk about it with family, friends, or co-workers. ? Try to think realistically about stressful events and not ignore them or overreact. ? Try to  find the positives in a stressful situation and not focus on the negatives. ? Cut back on responsibilities at work and home, if possible. Ask for help from friends or family members if you need it.  Find ways to cope with stress, such as: ? Meditation. ? Deep breathing. ? Yoga or tai chi. ? Progressive muscle relaxation. ? Doing art, playing music, or reading. ? Making time for fun activities. ? Spending time with family and friends.  Get support from family, friends, or spiritual resources. Eating and drinking  Eat a healthy diet. This includes: ? Eating foods that are high in fiber, such as beans, whole grains, and fresh fruits and vegetables. ? Limiting foods that are high in fat and processed sugars, such as fried and sweet foods.  Do not skip meals or overeat.  Drink enough fluid to keep your urine pale yellow. Alcohol use  Do not drink alcohol if: ? Your health care provider tells you not to drink. ? You are pregnant, may be pregnant, or are planning to become pregnant.  Drinking alcohol is a way some people try to ease their stress. This can be dangerous, so if you drink alcohol: ? Limit how much you use to:  0-1 drink a day for women.  0-2 drinks a day for men. ? Be aware of how much alcohol is in your drink. In the [place] one drink equals one 12 oz bottle of beer (355 mL), one 5 oz glass of wine (148 mL), or one 1 oz glass of hard liquor (44 mL). Activity   Include 30 minutes of exercise in your daily schedule. Exercise is a good stress  reducer.  Include time in your day for an activity that you find relaxing. Try taking a walk, going on a bike ride, reading a book, or listening to music.  Schedule your time in a way that lowers stress, and keep a consistent schedule. Prioritize what is most important to get done. General instructions  Get enough sleep. Try to go to sleep and get up at about the same time every day.  Take over-the-counter and prescription  medicines only as told by your health care provider.  Do not use any products that contain nicotine or tobacco, such as cigarettes, e-cigarettes, and chewing tobacco. If you need help quitting, ask your health care provider.  Do not use drugs or smoke to cope with stress.  Keep all follow-up visits as told by your health care provider. This is important. Where to find support  Talk with your health care provider about stress management or finding a support group.  Find a therapist to work with you on your stress management techniques. Contact a health care provider if:  Your stress symptoms get worse.  You are unable to manage your stress at home.  You are struggling to stop using drugs or alcohol. Get help right away if:  You may be a danger to yourself or others.  You have any thoughts of death or suicide. If you ever feel like you may hurt yourself or others, or have thoughts about taking your own life, get help right away. You can go to your nearest emergency department or call:  Your local emergency services (911 in the [place]  A suicide crisis helpline, such as the [place] at (872)179-4647. This is open 24 hours a day. Summary  Feeling a certain amount of stress is normal, but severe or long-lasting (chronic) stress can affect your mental and physical health.  Chronic stress can put you at higher risk for anxiety, depression, and other health problems like digestive problems, muscle aches, heart disease, high blood pressure, and stroke.  You may be at higher risk for stress-related problems if you do not get enough sleep, are in poor health, lack emotional support, or have a mental health disorder like anxiety or depression.  Identify the source of your stress and your reaction to it. Try talking about stressful events with family, friends, or co-workers, finding a coping method, or getting support from spiritual resources.  If you need more  help, talk with your health care provider about finding a support group or a mental health therapist. This information is not intended to replace advice given to you by your health care provider. Make sure you discuss any questions you have with your health care provider. Document Revised: 01/31/2019 Document Reviewed: 01/31/2019 Elsevier Patient Education  [place].

## 2020-06-18 ENCOUNTER — Telehealth: Payer: Self-pay | Admitting: Family

## 2020-06-18 NOTE — Telephone Encounter (Signed)
Please call patient to be sure he was able to schedule an appointment with counselor

## 2020-06-18 NOTE — Progress Notes (Signed)
Acute Office Visit  Subjective:    Patient ID: Shawn Garcia, male    DOB: 03-13-1996, 24 y.o.   MRN: [id number]  Chief Complaint  Patient presents with  . Headache    Pt c/o having a migraine starting last week, worsening for 3 days stright.  Pt said it started from the back of his neck and to lt side of head.  Pt said now that now it on and off since last Friday, pt had a nosebleed on Friday.    HPI Patient is in today with c/o headaches that started last week that have waxed and waned in intensity. Has a history of Migraines as a child but has not had a headache since that time. Pain currently 3/10 but can increased to 8/10. Not taking any medication for relief. Denies any sensitivity to light, noise, nausea, vomiting,no aura. Patient reports the headache feels similar to when he had COVID-19 but gets regular testing and has been negative. Patient admits to being stressed, not happy with career currently, and having a limited support system. He reports being diagnosed with ulcers as a result of stress in the past.He has been encouraged to seek counseling in the past but has not yet. Admits to being anxious and depressed. He really does not like his job. 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 Martial Ecologist who he really connected with passed away in 2017-11-10.   Past Medical History:  Diagnosis Date  . Dyspnea    dyspnea on exertion  . Left ventricular noncompaction cardiomyopathy (HCC)   . Umbilical hernia     Past Surgical History:  Procedure Laterality Date  . INSERTION OF MESH N/A 08/08/2018   Procedure: INSERTION OF MESH;  Surgeon: Manus Rudd, MD;  Location: [facility];  Service: General;  Laterality: N/A;  . UMBILICAL HERNIA REPAIR     childhood he thinks and CT shows changes  . UMBILICAL HERNIA REPAIR N/A 08/08/2018   Procedure: HERNIA REPAIR UMBILICAL ADULT;  Surgeon: Manus Rudd, MD;  Location: [facility];  Service: General;  Laterality: N/A;    Family History  Problem Relation Age of Onset  . Diabetes Maternal Grandmother   . Diabetes Maternal Grandfather     Social History   Socioeconomic History  . Marital status: Single    Spouse name: Not on file  . Number of children: Not on file  . Years of education: Not on file  . Highest education level: Not on file  Occupational History    Employer: [employer]  Tobacco Use  . Smoking status: Former Smoker    Quit date: 10/18/2016    Years since quitting: 3.6  . Smokeless tobacco: Never Used  Vaping Use  . Vaping Use: Former  . Start date: 07/19/2014  . Quit date: 07/19/2016  Substance and Sexual Activity  . Alcohol use: Yes    Comment: occas  . Drug use: No  . Sexual activity: Never  Other Topics Concern  . Not on file  Social History Narrative   Single, no kids   EtOH 2x/week   No tobacco or drug use   Working at [employer]   Also a Comptroller    Social Determinants of Health   Financial Resource Strain:   . Difficulty of Paying Living Expenses: Not on file  Food Insecurity:   . Worried About Programme researcher, broadcasting/film/video in the Last Year: Not  on file  . Ran Out of Food in the Last Year: Not on file  Transportation Needs:   . Lack of Transportation (Medical): Not on file  . Lack of Transportation (Non-Medical): Not on file  Physical Activity:   . Days of Exercise per Week: Not on file  . Minutes of Exercise per Session: Not on file  Stress:   . Feeling of Stress : Not on file  Social Connections:   . Frequency of Communication with Friends and Family: Not on file  . Frequency of Social Gatherings with Friends and Family: Not on file  . Attends Religious Services: Not on file  . Active Member of Clubs or Organizations: Not on file  . Attends Banker Meetings: Not on file  . Marital Status: Not on file  Intimate Partner Violence:   . Fear of Current or Ex-Partner: Not on file  .  Emotionally Abused: Not on file  . Physically Abused: Not on file  . Sexually Abused: Not on file    Outpatient Medications Prior to Visit  Medication Sig Dispense Refill  . Ascorbic Acid (VITAMIN C) 1000 MG tablet Take by mouth.    . Cholecalciferol (VITAMIN D3 PO) Take by mouth.    . ELDERBERRY PO Take by mouth.    . famotidine (PEPCID) 40 MG tablet Take by mouth.    . losartan (COZAAR) 25 MG tablet Take 25 mg by mouth daily.    . metoprolol succinate (TOPROL-XL) 25 MG 24 hr tablet Take 25 mg by mouth daily.    . Multiple Vitamin (MULTI-DAY VITAMINS) TABS Take by mouth.    . TURMERIC PO Take by mouth.    Marland Kitchen albuterol (VENTOLIN HFA) 108 (90 Base) MCG/ACT inhaler Inhale 2 puffs into the lungs every 6 (six) hours as needed for wheezing or shortness of breath. 18 g 3  . doxycycline (VIBRA-TABS) 100 MG tablet Take 1 tablet by mouth twice daily (Patient not taking: Reported on 02/18/2020) 20 tablet 0  . GLUCOSAMINE SULFATE PO Take by mouth.    . metroNIDAZOLE (FLAGYL) 500 MG tablet Take 4 pills at once with food. No alcohol within 24 hours of taking medicine. (Patient not taking: Reported on 06/17/2020) 4 tablet 0   No facility-administered medications prior to visit.    No Known Allergies  Review of Systems  Constitutional: Negative.   Eyes: Negative.  Negative for photophobia.  Respiratory: Negative.   Cardiovascular: Negative.   Gastrointestinal: Negative.   Endocrine: Negative.   Musculoskeletal: Negative.   Skin: Negative.   Neurological: Positive for headaches. Negative for dizziness and numbness.  Psychiatric/Behavioral: Negative for self-injury. The patient is nervous/anxious.        Increased stress, anxiety and depression       Objective:    Physical Exam Constitutional:      Appearance: He is well-developed and normal weight.  HENT:     Head: Normocephalic and atraumatic.  Eyes:     Extraocular Movements: Extraocular movements intact.     Pupils: Pupils are equal,  round, and reactive to light.  Cardiovascular:     Rate and Rhythm: Normal rate and regular rhythm.     Heart sounds: Normal heart sounds.  Pulmonary:     Effort: Pulmonary effort is normal.  Abdominal:     Palpations: Abdomen is soft.  Musculoskeletal:        General: Normal range of motion.     Cervical back: Normal range of motion and neck supple.  Neurological:  Mental Status: He is alert and oriented to person, place, and time. Mental status is at baseline.     Cranial Nerves: No cranial nerve deficit or facial asymmetry.     Deep Tendon Reflexes: Reflexes normal.  Psychiatric:     Comments: Tearful during interview, but stable     BP 100/80 (BP Location: Left Arm, Patient Position: Sitting, Cuff Size: Normal)   Pulse 83   Temp (!) 97.5 F (36.4 C)   Ht 5\' 7"  (1.702 m)   Wt 146 lb 3.2 oz (66.3 kg)   SpO2 94%   BMI 22.90 kg/m  Wt Readings from Last 3 Encounters:  06/17/20 146 lb 3.2 oz (66.3 kg)  02/18/20 142 lb 12.8 oz (64.8 kg)  12/03/19 141 lb 3.2 oz (64 kg)    Health Maintenance Due  Topic Date Due  . Hepatitis C Screening  Never done  . COVID-19 Vaccine (1) Never done  . INFLUENZA VACCINE  Never done    There are no preventive care reminders to display for this patient.   Lab Results  Component Value Date   TSH 0.72 12/03/2019   Lab Results  Component Value Date   WBC 3.2 (L) 12/03/2019   HGB 13.5 12/03/2019   HCT 42.4 12/03/2019   MCV 73.6 (L) 12/03/2019   PLT 243.0 12/03/2019   Lab Results  Component Value Date   NA 139 12/03/2019   K 4.5 12/03/2019   CO2 29 12/03/2019   GLUCOSE 92 12/03/2019   BUN 12 12/03/2019   CREATININE 0.95 12/03/2019   BILITOT 0.7 12/03/2019   ALKPHOS 60 12/03/2019   AST 16 12/03/2019   ALT 11 12/03/2019   PROT 7.2 12/03/2019   ALBUMIN 4.6 12/03/2019   CALCIUM 9.5 12/03/2019   ANIONGAP 9 01/03/2019   GFR 116.88 12/03/2019   Lab Results  Component Value Date   CHOL 126 12/03/2019   Lab Results    Component Value Date   HDL 49.60 12/03/2019   Lab Results  Component Value Date   LDLCALC 70 12/03/2019   Lab Results  Component Value Date   TRIG 36.0 12/03/2019   Lab Results  Component Value Date   CHOLHDL 3 12/03/2019   No results found for: HGBA1C     Assessment & Plan:   Problem List Items Addressed This Visit    None    Visit Diagnoses    Stress reaction    -  Primary   Headache, emotional tension          Offered medication option but patient currently would like to try therapy first. Advised cardiovascular exercise. Patient will call me if he is unable to reach counselor and I will call and schedule appt.   12/05/2019, FNP

## 2020-06-19 NOTE — Telephone Encounter (Signed)
Left a detailed message on voicemail to call office. 

## 2020-06-19 NOTE — Telephone Encounter (Signed)
Patient returned call to the office. Please give him a call back.

## 2020-06-20 MED ORDER — IBUPROFEN 800 MG PO TABS: 800.0000 mg | ORAL_TABLET | Freq: Three times a day (TID) | ORAL | 0 refills | Status: AC | PRN

## 2020-06-20 NOTE — Addendum Note (Signed)
Addended by: Worthy Rancher B on: 06/20/2020 03:21 PM   Modules accepted: Orders

## 2020-06-20 NOTE — Telephone Encounter (Signed)
Pt said that he will call to schedule today.  Pt explained that he is still concerned about headache and will a returned phone call to see if there is something else he can do about them.  Please advise.  Cb# [phone]

## 2020-06-20 NOTE — Telephone Encounter (Signed)
Left a detailed message on voicemail, to call office back.  Wanting to ask if he has gotten in touch with a counselor yet.

## 2020-06-20 NOTE — Telephone Encounter (Signed)
Left a detailed message on voicemail.  Informing pt of message below.

## 2020-07-02 ENCOUNTER — Telehealth: Payer: Self-pay | Admitting: Family Medicine

## 2020-07-02 NOTE — Telephone Encounter (Signed)
Patient states that the Psychiatry office he was referred to does not accept his insurance and he would like to be referred to another office. Please call him at  [phone] if you have any questions.

## 2020-07-03 NOTE — Telephone Encounter (Signed)
[facility] will require a [facility] office. Per website psychiatry is only offered in [facility]. Please notify pt and fax to [facility] office.

## 2020-07-03 NOTE — Telephone Encounter (Signed)
Please see message and advise.  Thank you. ° °

## 2020-07-03 NOTE — Telephone Encounter (Signed)
I let pt know his options and he said he is just going to hold off for now and will call back when he decides what he wants to do

## 2020-07-03 NOTE — Telephone Encounter (Signed)
Please advise message below any other recommendations for Psychiatrist?

## 2020-10-08 ENCOUNTER — Telehealth: Payer: Self-pay | Admitting: *Deleted

## 2020-10-08 NOTE — Telephone Encounter (Signed)
Patient is calling to confirm his appointment for 10/13/20.

## 2020-10-13 ENCOUNTER — Ambulatory Visit (INDEPENDENT_AMBULATORY_CARE_PROVIDER_SITE_OTHER): Payer: Self-pay | Admitting: Podiatry

## 2020-10-13 ENCOUNTER — Other Ambulatory Visit: Payer: Self-pay

## 2020-10-13 ENCOUNTER — Ambulatory Visit (INDEPENDENT_AMBULATORY_CARE_PROVIDER_SITE_OTHER): Payer: Self-pay

## 2020-10-13 ENCOUNTER — Encounter: Payer: Self-pay | Admitting: Podiatry

## 2020-10-13 DIAGNOSIS — M779 Enthesopathy, unspecified: Secondary | ICD-10-CM

## 2020-10-13 DIAGNOSIS — S99912S Unspecified injury of left ankle, sequela: Secondary | ICD-10-CM

## 2020-10-13 DIAGNOSIS — S96912S Strain of unspecified muscle and tendon at ankle and foot level, left foot, sequela: Secondary | ICD-10-CM

## 2020-10-13 DIAGNOSIS — I429 Cardiomyopathy, unspecified: Secondary | ICD-10-CM | POA: Insufficient documentation

## 2020-10-13 DIAGNOSIS — S99922A Unspecified injury of left foot, initial encounter: Secondary | ICD-10-CM

## 2020-10-13 MED ORDER — METHYLPREDNISOLONE 4 MG PO TBPK: ORAL_TABLET | ORAL | 0 refills | Status: AC

## 2020-10-13 NOTE — Progress Notes (Signed)
Subjective:   Patient ID: Shawn Garcia, male   DOB: 25 y.o.   MRN: [id number]   HPI 25 year old male presents the office today for concerns of a left foot injury which occurred in October 2021.  He states that a forklift was pinned between his foot and the wall.  He had immediate pain and bruising along the foot on the medial aspect.  He iced it initially.  He was seen 4 days later for treatment.  He was out of work for short amount of time however he had to return to work.  He states that over the last couple of months he has continued to have discomfort pointing mostly the medial aspect ankle ulcer on the bottom of his heel as well as the Achilles tendon.  He is also to get some right Achilles tendon discomfort from compensation.  No recent injury or trauma otherwise.  No significant swelling.  Has no other concerns.   Review of Systems  All other systems reviewed and are negative.  Past Medical History:  Diagnosis Date  . Dyspnea    dyspnea on exertion  . Left ventricular noncompaction cardiomyopathy (HCC)   . Umbilical hernia     Past Surgical History:  Procedure Laterality Date  . INSERTION OF MESH N/A 08/08/2018   Procedure: INSERTION OF MESH;  Surgeon: Manus Rudd, MD;  Location: [facility];  Service: General;  Laterality: N/A;  . UMBILICAL HERNIA REPAIR     childhood he thinks and CT shows changes  . UMBILICAL HERNIA REPAIR N/A 08/08/2018   Procedure: HERNIA REPAIR UMBILICAL ADULT;  Surgeon: Manus Rudd, MD;  Location: [facility];  Service: General;  Laterality: N/A;     Current Outpatient Medications:  .  Fluocinolone Acetonide Body 0.01 % OIL, Apply twice a week night time on scalp , do not need to rinse, Disp: , Rfl:  .  methylPREDNISolone (MEDROL DOSEPAK) 4 MG TBPK tablet, Take as directed, Disp: 21 tablet, Rfl: 0 .  albuterol (VENTOLIN HFA) 108 (90 Base) MCG/ACT inhaler, Inhale 2 puffs into the lungs every 6 (six) hours as needed for  wheezing or shortness of breath., Disp: 18 g, Rfl: 3 .  Ascorbic Acid (VITAMIN C) 1000 MG tablet, Take by mouth., Disp: , Rfl:  .  Cholecalciferol (VITAMIN D3 PO), Take by mouth., Disp: , Rfl:  .  doxycycline (VIBRA-TABS) 100 MG tablet, Take 1 tablet by mouth twice daily (Patient not taking: Reported on 02/18/2020), Disp: 20 tablet, Rfl: 0 .  ELDERBERRY PO, Take by mouth., Disp: , Rfl:  .  famotidine (PEPCID) 40 MG tablet, Take by mouth., Disp: , Rfl:  .  GLUCOSAMINE SULFATE PO, Take by mouth., Disp: , Rfl:  .  ibuprofen (ADVIL) 800 MG tablet, Take 1 tablet (800 mg total) by mouth every 8 (eight) hours as needed., Disp: 30 tablet, Rfl: 0 .  ketoconazole (NIZORAL) 2 % cream, SMARTSIG:1 Application Topical 1 to 2 Times Daily, Disp: , Rfl:  .  losartan (COZAAR) 25 MG tablet, Take 25 mg by mouth daily., Disp: , Rfl:  .  metoprolol succinate (TOPROL-XL) 25 MG 24 hr tablet, Take 25 mg by mouth daily., Disp: , Rfl:  .  metroNIDAZOLE (FLAGYL) 500 MG tablet, Take 4 pills at once with food. No alcohol within 24 hours of taking medicine. (Patient not taking: Reported on 06/17/2020), Disp: 4 tablet, Rfl: 0 .  Multiple Vitamin (MULTI-DAY VITAMINS) TABS, Take by mouth., Disp: , Rfl:  .  predniSONE (  DELTASONE) 10 MG tablet, Take by mouth., Disp: , Rfl:  .  triamcinolone (KENALOG) 0.1 %, Apply topically daily., Disp: , Rfl:  .  TURMERIC PO, Take by mouth., Disp: , Rfl:   No Known Allergies       Objective:  Physical Exam  General: AAO x3, NAD  Dermatological: Skin is warm, dry and supple bilateral. There are no open sores, no preulcerative lesions, no rash or signs of infection present.  Vascular: Dorsalis Pedis artery and Posterior Tibial artery pedal pulses are 2/4 bilateral with immedate capillary fill time. There is no pain with calf compression, swelling, warmth, erythema.   Neruologic: Grossly intact via light touch bilateral.  Musculoskeletal: There is tenderness palpation of the mild distal  portion of the posterior tibial tendon on the insertion of the navicular tuberosity and there is minimal edema.  There is no erythema or warmth.  Mild discomfort on the plantar medial tubercle of the calcaneus at insertion of plantar fascial along the arch of the foot as well in the plantar fascial.  Minimal discomfort of the distal portion Achilles tendon.  Flexor, extensor tendons appear to be intact otherwise.  Thompson test is negative.  Muscular strength 5/5 in all groups tested bilateral.  Gait: Unassisted, Nonantalgic.       Assessment:   25 year old male with concern for partial tear posterior tibial tendon; tendinitis, plan fasciitis    Plan:  -Treatment options discussed including all alternatives, risks, and complications -Etiology of symptoms were discussed -X-rays were obtained and reviewed with the patient.  There is no active acute fracture.  There is some thickening of the Achilles tendon noted on the lateral view. -Given his ongoing nature of symptoms I recommended MRI which is ordered today. -Tri-Lock ankle brace dispensed. -Medrol Dosepak.  Vivi Barrack DPM

## 2020-10-13 NOTE — Patient Instructions (Addendum)
  If was nice to meet you today. If you have any questions or any further concerns, please feel fee to give me a call. You can call our office at [phone] or please feel fee to send me a message through MyChart.    For instructions on how to put on your Tri-Lock Ankle Brace, please visit [email or web address]    I have ordered a MRI of the left ankle for Shawn Garcia. If you do not hear for them about scheduling within the next 1 week, or you have any questions please give [place] a call at [phone].

## 2020-11-01 ENCOUNTER — Inpatient Hospital Stay: Admission: RE | Admit: 2020-11-01 | Payer: BLUE CROSS/BLUE SHIELD | Source: Ambulatory Visit

## 2020-11-10 ENCOUNTER — Other Ambulatory Visit: Payer: Self-pay

## 2020-11-10 ENCOUNTER — Encounter: Payer: Self-pay | Admitting: Podiatry

## 2020-11-10 ENCOUNTER — Ambulatory Visit (INDEPENDENT_AMBULATORY_CARE_PROVIDER_SITE_OTHER): Admitting: Podiatry

## 2020-11-10 DIAGNOSIS — S99912S Unspecified injury of left ankle, sequela: Secondary | ICD-10-CM | POA: Diagnosis not present

## 2020-11-10 DIAGNOSIS — M779 Enthesopathy, unspecified: Secondary | ICD-10-CM

## 2020-11-10 DIAGNOSIS — S96912S Strain of unspecified muscle and tendon at ankle and foot level, left foot, sequela: Secondary | ICD-10-CM | POA: Diagnosis not present

## 2020-11-10 NOTE — Progress Notes (Signed)
Subjective: 25 year old male presents the office today for follow evaluation of left foot and ankle discomfort.  He had a changes MRI appointment he can schedule for MRI until something.  He is been doing physical therapy which is helping some but he still gets discomfort points on medial aspect ankle occasionally Achilles tendon.  No recent injury or falls.  No increase in swelling. Denies any systemic complaints such as fevers, chills, nausea, vomiting. No acute changes since last appointment, and no other complaints at this time.   Objective: AAO x3, NAD DP/PT pulses palpable bilaterally, CRT less than 3 seconds For still mild tenderness palpation along the distal portion of the posterior tibial tendon on the insertion navicular tuberosity.  He has significant discomfort to the Achilles tendon today.  Flexor, extensor tendons appear to be intact.  No edema, erythema.  MMT 5/5.  No pain with calf compression, swelling, warmth, erythema  Assessment: Rule out partial tear posterior tibial tendon  Plan: -All treatment options discussed with the patient including all alternatives, risks, complications.  -Awaiting MRI.  Scheduled for on Sunday.  Continue physical therapy for now.  I will call him with the results of the MRI.  Continue with supportive shoes and good arch supports. -Patient encouraged to call the office with any questions, concerns, change in symptoms.   Vivi Barrack DPM

## 2020-11-16 ENCOUNTER — Other Ambulatory Visit: Payer: BLUE CROSS/BLUE SHIELD

## 2021-04-02 ENCOUNTER — Ambulatory Visit: Payer: BLUE CROSS/BLUE SHIELD | Admitting: Family Medicine

## 2023-02-25 ENCOUNTER — Telehealth: Payer: Self-pay | Admitting: Family Medicine

## 2023-02-25 NOTE — Telephone Encounter (Signed)
Patient has not been seen by current PCP in a year. Reached out to the patient to see if they have a new PCP or if they would like to continue care with the provider.  LVM to schedule
